# Patient Record
Sex: Male | Born: 1965
Health system: Southern US, Community
[De-identification: ages and names within clinical notes are randomized; demographics above are authoritative.]

## PROBLEM LIST (undated history)

## (undated) DIAGNOSIS — G4733 Obstructive sleep apnea (adult) (pediatric): Secondary | ICD-10-CM

## (undated) DIAGNOSIS — R9431 Abnormal electrocardiogram [ECG] [EKG]: Secondary | ICD-10-CM

## (undated) DIAGNOSIS — N3281 Overactive bladder: Secondary | ICD-10-CM

## (undated) DIAGNOSIS — E7800 Pure hypercholesterolemia, unspecified: Secondary | ICD-10-CM

## (undated) DIAGNOSIS — B029 Zoster without complications: Secondary | ICD-10-CM

## (undated) DIAGNOSIS — C801 Malignant (primary) neoplasm, unspecified: Secondary | ICD-10-CM

## (undated) DIAGNOSIS — I10 Essential (primary) hypertension: Secondary | ICD-10-CM

## (undated) HISTORY — DX: Malignant (primary) neoplasm, unspecified: C80.1

## (undated) HISTORY — DX: Pure hypercholesterolemia, unspecified: E78.00

## (undated) HISTORY — PX: MAXILLARY LE FORTE I OSTEOTOMY: SHX2005

## (undated) HISTORY — DX: Essential (primary) hypertension: I10

## (undated) HISTORY — DX: Overactive bladder: N32.81

## (undated) HISTORY — DX: Obstructive sleep apnea (adult) (pediatric): G47.33

## (undated) HISTORY — PX: SEPTOPLASTY: SUR1290

## (undated) HISTORY — DX: Abnormal electrocardiogram (ECG) (EKG): R94.31

## (undated) HISTORY — DX: Zoster without complications: B02.9

---

## 1997-12-13 HISTORY — PX: VEIN LIGATION AND STRIPPING: SHX2653

## 2001-12-13 HISTORY — PX: VASECTOMY: SHX75

## 2010-12-13 HISTORY — PX: BASAL CELL CARCINOMA EXCISION: SHX1214

## 2011-09-15 ENCOUNTER — Encounter: Payer: Self-pay | Admitting: Vascular Surgery

## 2011-10-05 ENCOUNTER — Encounter: Payer: Self-pay | Admitting: Vascular Surgery

## 2011-10-06 ENCOUNTER — Encounter: Payer: Self-pay | Admitting: Vascular Surgery

## 2011-10-15 ENCOUNTER — Encounter: Payer: Self-pay | Admitting: Vascular Surgery

## 2011-10-18 ENCOUNTER — Encounter: Payer: Self-pay | Admitting: Vascular Surgery

## 2011-10-18 ENCOUNTER — Ambulatory Visit (INDEPENDENT_AMBULATORY_CARE_PROVIDER_SITE_OTHER): Payer: BC Managed Care – PPO | Admitting: Vascular Surgery

## 2011-10-18 ENCOUNTER — Other Ambulatory Visit: Payer: Self-pay | Admitting: *Deleted

## 2011-10-18 VITALS — BP 129/83 | HR 95 | Resp 18 | Ht 72.0 in | Wt 190.0 lb

## 2011-10-18 DIAGNOSIS — I83893 Varicose veins of bilateral lower extremities with other complications: Secondary | ICD-10-CM

## 2011-10-18 NOTE — Progress Notes (Signed)
Subjective:     Patient ID: Derek Chambers, male   DOB: 31-Dec-1965, 45 y.o.   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 He had a partial vein stripping performed in [employer] 14 years ago. At that time he had some bulging varicosities in the right distal thigh. It is not wear elastic compression stockings nor elevate his legs on a regular basis. He now has some aching and throbbing discomfort in the right distal thigh medially and has developed a pattern of superficial bluish discoloration in that area. He has no history of DVT, thrombophlebitis, stasis ulcers, or bleeding.  History reviewed. No pertinent past medical history.  History  Substance Use Topics  . Smoking status: Never Smoker   . Smokeless tobacco: Never Used  . Alcohol Use: Yes    Family History  Problem Relation Age of Onset  . Cancer Mother     Allergies no known allergies  Current outpatient prescriptions:amitriptyline (ELAVIL) 25 MG tablet, Take 25 mg by mouth at bedtime.  , Disp: , Rfl: ;  aspirin 81 MG tablet, Take 81 mg by mouth daily. 2 tabs qd , Disp: , Rfl: ;  glucosamine-chondroitin 500-400 MG tablet, Take 1 tablet by mouth 1 day or 1 dose.  , Disp: , Rfl: ;  magnesium 30 MG tablet, Take 30 mg by mouth 1 day or 1 dose.  , Disp: , Rfl: ;  Melatonin 1 MG TABS, Take by mouth. At bedtime  , Disp: , Rfl:   BP 129/83  Pulse 95  Resp 18  Ht 6' (1.829 m)  Wt 190 lb (86.183 kg)  BMI 25.77 kg/m2  Body mass index is 25.77 kg/(m^2).         Review of Systems denies chest pain, dyspnea on exertion, PND, orthopnea, his review of systems is totally negative     Objective:   Physical Exam blood pressure 129/83 heart rate 85 respirations 18 General he is a well-developed well-nourished male in no apparent distress alert and oriented x3 HEENT normal for age Lungs no rhonchi or wheezing Cardiovascular regular with no murmurs carotid pulses 3+ no audible  bruits Abdomen soft nontender with no masses Lower extremity exam the ulcer plus femoral popliteal and dorsalis pedis pulses palpable bilaterally Neurologic normal Muscle skeletal free of major deformities Skin exam reveals some reticular and spider veins in the medial distal thigh of the right leg. No bulging varicosities are noted. There is no hyperpigmentation or ulceration or distal edema noted.  Today I performed a bedside SonoSite ultrasound exam. I do not see any large great saphenous vein on the right with reflux from the saphenofemoral junction to the knee although the saphenous vein is present but small. Similarly I do not see any large small saphenous vein with reflux.    Assessment:     Reticular and spider veins and right distal thigh-status post vein stripping performed 14 years ago right leg-now symptomatic    Plan:    return for formal venous duplex exam right leg. No evidence of reflux may require sclerotherapy.

## 2011-10-20 ENCOUNTER — Other Ambulatory Visit: Payer: Self-pay

## 2011-10-20 DIAGNOSIS — I83893 Varicose veins of bilateral lower extremities with other complications: Secondary | ICD-10-CM

## 2011-11-12 ENCOUNTER — Encounter: Payer: Self-pay | Admitting: Vascular Surgery

## 2011-11-15 ENCOUNTER — Other Ambulatory Visit (INDEPENDENT_AMBULATORY_CARE_PROVIDER_SITE_OTHER): Payer: BC Managed Care – PPO

## 2011-11-15 ENCOUNTER — Ambulatory Visit (INDEPENDENT_AMBULATORY_CARE_PROVIDER_SITE_OTHER): Payer: BC Managed Care – PPO | Admitting: Vascular Surgery

## 2011-11-15 ENCOUNTER — Encounter: Payer: Self-pay | Admitting: Vascular Surgery

## 2011-11-15 VITALS — BP 132/96 | HR 78 | Resp 18 | Ht 72.0 in | Wt 192.0 lb

## 2011-11-15 DIAGNOSIS — I83893 Varicose veins of bilateral lower extremities with other complications: Secondary | ICD-10-CM

## 2011-11-15 NOTE — Progress Notes (Signed)
Subjective:     Patient ID: Derek Chambers, male   DOB: February 22, 1966, 45 y.o.   MRN: [id number]  HPI this 45 year old male patient returns for further evaluation of his venous insufficiency in the right lower thigh. 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 He has no history of DVT or thrombophlebitis. Today he returned for a venous duplex exam which I reviewed and interpreted.  There is no significant great saphenous vein remaining in the right leg and no reflux at the saphenofemoral junction. There is no DVT. He does have an isolated area of a subcutaneous varicosity in the right distal thigh which communicates with the reticular and spider veins and the skin overlying this area. No incompetent perforators noted. The deep system is unremarkable.  No past medical history on file.  History  Substance Use Topics  . Smoking status: Never Smoker   . Smokeless tobacco: Never Used  . Alcohol Use: Yes    Family History  Problem Relation Age of Onset  . Cancer Mother     Allergies no known allergies  Current outpatient prescriptions:amitriptyline (ELAVIL) 25 MG tablet, Take 25 mg by mouth at bedtime.  , Disp: , Rfl: ;  aspirin 81 MG tablet, Take 81 mg by mouth daily. 2 tabs qd , Disp: , Rfl: ;  glucosamine-chondroitin 500-400 MG tablet, Take 1 tablet by mouth 1 day or 1 dose.  , Disp: , Rfl: ;  magnesium 30 MG tablet, Take 30 mg by mouth 1 day or 1 dose.  , Disp: , Rfl: ;  Melatonin 1 MG TABS, Take by mouth. At bedtime  , Disp: , Rfl:   BP 132/96  Pulse 78  Resp 18  Ht 6' (1.829 m)  Wt 192 lb (87.091 kg)  BMI 26.04 kg/m2  Body mass index is 26.04 kg/(m^2).         Review of Systems     Objective:   Physical Exam blood pressure 130/96 heart rate 78 respirations 18 Right lower  extremity exam reveals superficial reticular and spider veins in the distal third of the right thigh with no bulging varicosities noted no distal edema no hyperpigmentation or ulceration. He has 3+ dorsalis pedis pulse palpable in the right foot.     Assessment:    symptomatic reticular and spider veins right distal thigh post ligation and stripping right great saphenous vein 14 years ago    Plan:    needs sclerotherapy right distal thigh for reticular and spider veins which are symptomatic we'll schedule in near future

## 2012-01-05 NOTE — Procedures (Unsigned)
LOWER EXTREMITY VENOUS REFLUX EXAM  INDICATION:  Partial right lower extremity vein stripping 14 years ago. The patient presents with aching and throbbing of the distal right thigh.  No history of previous DVT or thrombophlebitis.  EXAM:  Using color-flow imaging and pulse Doppler spectral analysis, the right common femoral, superficial femoral, popliteal, posterior tibial, greater and lesser saphenous veins are evaluated.  There is no evidence suggesting deep venous insufficiency in the right lower extremity.  The left lower extremity was not evaluated.  The right saphenofemoral junction is competent.  The right GSV is competent in the proximal thigh.  The right GSV is absent, status post ligation and stripping from the mid thigh to the mid calf.  There is a branch out of the fascia from the mid to distal thigh.  There is a 0.43 cm area which demonstrates reflux in the branch in the distal thigh.  GSV Diameter (used if found to be incompetent only)                                           Right    Left Proximal Greater Saphenous Vein           0.78 cm  cm Proximal-to-mid-thigh                     0.43 cm  cm Mid thigh                                 cm       cm Mid-distal thigh                          cm       cm Distal thigh                              cm       cm Knee                                      cm       cm  IMPRESSION: 1. The right great saphenous vein is competent in the proximal thigh.     The greater saphenous vein is absent from the mid thigh to the mid     calf. 2. The right great saphenous vein is not tortuous. 3. The deep venous system is competent. 4. The right small saphenous vein is competent. 5.  Incompetence is present in a branch in the distal thigh at the     point of the patient's discomfort.  ___________________________________________ Quita Skye. Hart Rochester, M.D.  CI/MEDQ  D:  11/15/2011  T:  11/15/2011  Job:  [id number]

## 2012-02-01 ENCOUNTER — Encounter: Payer: Self-pay | Admitting: *Deleted

## 2012-02-01 ENCOUNTER — Ambulatory Visit (INDEPENDENT_AMBULATORY_CARE_PROVIDER_SITE_OTHER): Payer: BC Managed Care – PPO | Admitting: *Deleted

## 2012-02-01 DIAGNOSIS — I83893 Varicose veins of bilateral lower extremities with other complications: Secondary | ICD-10-CM

## 2012-02-01 NOTE — Progress Notes (Signed)
X=.3% Sotradecol administered with a 27g butterfly.  Patient received a total of 6cc foam.  Treated area where reflux is present. Bit of a difficult stick but able to get in with blood return. Will follow prn.  Photos: yes  Compression stockings applied: yes and ace wrap over treated area.

## 2012-02-02 ENCOUNTER — Encounter: Payer: Self-pay | Admitting: Vascular Surgery

## 2014-08-08 ENCOUNTER — Telehealth: Payer: Self-pay

## 2014-08-08 ENCOUNTER — Encounter: Payer: Self-pay | Admitting: Gastroenterology

## 2014-08-08 NOTE — Telephone Encounter (Signed)
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 Turned 48 this year and needs screening colonoscopy at [facility], Wants to try without sedation but will take an IV just in case he needs meds. Can you contact him about scheduling. He's in no hurry, just would like it done before end of the year.   Thanks   His cell is [phone]

## 2014-08-22 NOTE — Telephone Encounter (Signed)
Procedure has been scheduled.

## 2014-10-02 ENCOUNTER — Ambulatory Visit (AMBULATORY_SURGERY_CENTER): Payer: Self-pay | Admitting: *Deleted

## 2014-10-02 ENCOUNTER — Telehealth: Payer: Self-pay | Admitting: *Deleted

## 2014-10-02 VITALS — Ht 72.0 in | Wt 197.6 lb

## 2014-10-02 DIAGNOSIS — Z1211 Encounter for screening for malignant neoplasm of colon: Secondary | ICD-10-CM

## 2014-10-02 MED ORDER — MOVIPREP 100 G PO SOLR: 1.0000 | Freq: Once | ORAL | Status: DC

## 2014-10-02 NOTE — Telephone Encounter (Signed)
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 TE from 08/08/14 states he does not want sedation but will take IV in case, after speaking with Dr Derek Chambers, he is definitely for sedation, he states "yes I want some kind of sedation", we spoke about the option of fentanyl-versed and propofol, final decision was to have propofol during colonoscopy, he may wanted to speak to you about sedation on 11/3 as well, thanks-adm

## 2014-10-02 NOTE — Progress Notes (Signed)
No egg or soy allergy. No anesthesia problems.  No home O2.  No diet meds.  

## 2014-10-03 NOTE — Telephone Encounter (Signed)
Ok, thanks.

## 2014-10-08 ENCOUNTER — Encounter: Payer: Self-pay | Admitting: Gastroenterology

## 2014-10-15 ENCOUNTER — Ambulatory Visit (AMBULATORY_SURGERY_CENTER): Payer: BC Managed Care – PPO | Admitting: Gastroenterology

## 2014-10-15 ENCOUNTER — Encounter: Payer: Self-pay | Admitting: Gastroenterology

## 2014-10-15 VITALS — BP 123/85 | HR 65 | Temp 97.6°F | Resp 13 | Ht 72.0 in | Wt 197.0 lb

## 2014-10-15 DIAGNOSIS — Z1211 Encounter for screening for malignant neoplasm of colon: Secondary | ICD-10-CM

## 2014-10-15 MED ORDER — SODIUM CHLORIDE 0.9 % IV SOLN: 500.0000 mL | INTRAVENOUS | Status: DC

## 2014-10-15 NOTE — Op Note (Addendum)
[place]  [address]. [place], [address]   COLONOSCOPY PROCEDURE REPORT  PATIENT: Derek Chambers, Derek Chambers  MR#: [id number] BIRTHDATE: 07/09/1966 , 49  yrs. old GENDER: male ENDOSCOPIST: Milus Banister REFERRED EK:BTCY Laurann Montana, M.D. PROCEDURE DATE:  10/15/2014 PROCEDURE:   Colonoscopy, screening First Screening Colonoscopy - Avg.  risk and is 50 yrs.  old or older Yes.  Prior Negative Screening - Now for repeat screening. N/A  History of Adenoma - Now for follow-up colonoscopy & has been > or = to 3 yrs.  N/A  Polyps Removed Today? No.  Recommend repeat exam, <10 yrs? No. ASA CLASS:   Class II INDICATIONS:average risk for colon cancer. MEDICATIONS: Monitored anesthesia care and Propofol 300 mg IV  DESCRIPTION OF PROCEDURE:   After the risks benefits and alternatives of the procedure were thoroughly explained, informed consent was obtained.  The digital rectal exam revealed no abnormalities of the rectum.   The LB [id number]  endoscope was introduced through the anus and advanced to the cecum, which was identified by both the appendix and ileocecal valve. No adverse events experienced.   The quality of the prep was good, using MoviPrep  The instrument was then slowly withdrawn as the colon was fully examined.   COLON FINDINGS: A normal appearing cecum, ileocecal valve, and appendiceal orifice were identified.  The ascending, transverse, descending, sigmoid colon, and rectum appeared unremarkable. Retroflexed views revealed no abnormalities. The time to cecum=3 minutes 51 seconds.  Withdrawal time=8 minutes 01 seconds.  The scope was withdrawn and the procedure completed. COMPLICATIONS: There were no immediate complications.  ENDOSCOPIC IMPRESSION: Normal colonoscopy No polyps or cancers  RECOMMENDATIONS: You should continue to follow colorectal cancer screening guidelines for "routine risk" patients with a repeat colonoscopy in 10 years.   eSigned:   Milus Banister, MD 05/14/2015 10:41 AM Revised: 05/14/2015 10:41 AM   Originally I documented "poor" prep however that was a mistake.  His prep was good.  I discussed this with Dr. Delma Post shortly after the procedure and I have now just fixed that error on his report.

## 2014-10-15 NOTE — Patient Instructions (Signed)

## 2014-10-15 NOTE — Progress Notes (Signed)
Report to PACU, RN, vss, BBS= Clear.  

## 2014-10-16 ENCOUNTER — Telehealth: Payer: Self-pay | Admitting: *Deleted

## 2014-10-16 NOTE — Telephone Encounter (Signed)
  Follow up Call-  Call back number 10/15/2014  Post procedure Call Back phone  # [phone]  Permission to leave phone message Yes     Patient questions:  Do you have a fever, pain , or abdominal swelling? No. Pain Score  0 *  Have you tolerated food without any problems? Yes.    Have you been able to return to your normal activities? Yes.    Do you have any questions about your discharge instructions: Diet   No. Medications  No. Follow up visit  No.  Do you have questions or concerns about your Care? No.  Actions: * If pain score is 4 or above: No action needed, pain <4.

## 2015-12-14 HISTORY — PX: MELANOMA EXCISION: SHX5266

## 2016-10-16 DIAGNOSIS — G4733 Obstructive sleep apnea (adult) (pediatric): Secondary | ICD-10-CM | POA: Diagnosis not present

## 2016-10-26 DIAGNOSIS — G4733 Obstructive sleep apnea (adult) (pediatric): Secondary | ICD-10-CM | POA: Diagnosis not present

## 2016-11-15 DIAGNOSIS — G4733 Obstructive sleep apnea (adult) (pediatric): Secondary | ICD-10-CM | POA: Diagnosis not present

## 2016-12-16 DIAGNOSIS — G4733 Obstructive sleep apnea (adult) (pediatric): Secondary | ICD-10-CM | POA: Diagnosis not present

## 2016-12-28 DIAGNOSIS — D225 Melanocytic nevi of trunk: Secondary | ICD-10-CM | POA: Diagnosis not present

## 2016-12-28 DIAGNOSIS — D1801 Hemangioma of skin and subcutaneous tissue: Secondary | ICD-10-CM | POA: Diagnosis not present

## 2016-12-28 DIAGNOSIS — L918 Other hypertrophic disorders of the skin: Secondary | ICD-10-CM | POA: Diagnosis not present

## 2016-12-28 DIAGNOSIS — Z85820 Personal history of malignant melanoma of skin: Secondary | ICD-10-CM | POA: Diagnosis not present

## 2016-12-28 DIAGNOSIS — D2271 Melanocytic nevi of right lower limb, including hip: Secondary | ICD-10-CM | POA: Diagnosis not present

## 2016-12-28 DIAGNOSIS — L738 Other specified follicular disorders: Secondary | ICD-10-CM | POA: Diagnosis not present

## 2016-12-28 DIAGNOSIS — L821 Other seborrheic keratosis: Secondary | ICD-10-CM | POA: Diagnosis not present

## 2016-12-28 DIAGNOSIS — Z85828 Personal history of other malignant neoplasm of skin: Secondary | ICD-10-CM | POA: Diagnosis not present

## 2016-12-28 DIAGNOSIS — D2272 Melanocytic nevi of left lower limb, including hip: Secondary | ICD-10-CM | POA: Diagnosis not present

## 2017-01-16 DIAGNOSIS — G4733 Obstructive sleep apnea (adult) (pediatric): Secondary | ICD-10-CM | POA: Diagnosis not present

## 2017-02-11 MED FILL — AMITRIPTYLINE HCL 25 MG TAB: 25 | 90 days supply | Qty: 90 | Fill #0

## 2017-02-13 DIAGNOSIS — G4733 Obstructive sleep apnea (adult) (pediatric): Secondary | ICD-10-CM | POA: Diagnosis not present

## 2017-03-11 DIAGNOSIS — G4733 Obstructive sleep apnea (adult) (pediatric): Secondary | ICD-10-CM | POA: Diagnosis not present

## 2017-03-16 DIAGNOSIS — G4733 Obstructive sleep apnea (adult) (pediatric): Secondary | ICD-10-CM | POA: Diagnosis not present

## 2017-04-15 DIAGNOSIS — G4733 Obstructive sleep apnea (adult) (pediatric): Secondary | ICD-10-CM | POA: Diagnosis not present

## 2017-06-21 DIAGNOSIS — D225 Melanocytic nevi of trunk: Secondary | ICD-10-CM | POA: Diagnosis not present

## 2017-06-21 DIAGNOSIS — C44619 Basal cell carcinoma of skin of left upper limb, including shoulder: Secondary | ICD-10-CM | POA: Diagnosis not present

## 2017-06-21 DIAGNOSIS — L738 Other specified follicular disorders: Secondary | ICD-10-CM | POA: Diagnosis not present

## 2017-06-21 DIAGNOSIS — Z85820 Personal history of malignant melanoma of skin: Secondary | ICD-10-CM | POA: Diagnosis not present

## 2017-06-21 DIAGNOSIS — D1801 Hemangioma of skin and subcutaneous tissue: Secondary | ICD-10-CM | POA: Diagnosis not present

## 2017-06-21 DIAGNOSIS — D485 Neoplasm of uncertain behavior of skin: Secondary | ICD-10-CM | POA: Diagnosis not present

## 2017-06-21 DIAGNOSIS — D2271 Melanocytic nevi of right lower limb, including hip: Secondary | ICD-10-CM | POA: Diagnosis not present

## 2017-06-21 DIAGNOSIS — L814 Other melanin hyperpigmentation: Secondary | ICD-10-CM | POA: Diagnosis not present

## 2017-06-29 DIAGNOSIS — C44619 Basal cell carcinoma of skin of left upper limb, including shoulder: Secondary | ICD-10-CM | POA: Diagnosis not present

## 2017-06-29 DIAGNOSIS — Z85828 Personal history of other malignant neoplasm of skin: Secondary | ICD-10-CM | POA: Diagnosis not present

## 2017-06-30 DIAGNOSIS — G4733 Obstructive sleep apnea (adult) (pediatric): Secondary | ICD-10-CM | POA: Diagnosis not present

## 2017-07-08 DIAGNOSIS — G4733 Obstructive sleep apnea (adult) (pediatric): Secondary | ICD-10-CM | POA: Diagnosis not present

## 2017-08-05 MED FILL — AMITRIPTYLINE HCL 25 MG TAB: 25 | 90 days supply | Qty: 90 | Fill #1

## 2017-08-30 DIAGNOSIS — N3281 Overactive bladder: Secondary | ICD-10-CM | POA: Diagnosis not present

## 2017-08-30 DIAGNOSIS — Z0000 Encounter for general adult medical examination without abnormal findings: Secondary | ICD-10-CM | POA: Diagnosis not present

## 2017-08-30 DIAGNOSIS — G4733 Obstructive sleep apnea (adult) (pediatric): Secondary | ICD-10-CM | POA: Diagnosis not present

## 2017-11-07 MED FILL — AMITRIPTYLINE HCL 25 MG TAB: 25 | 90 days supply | Qty: 90 | Fill #0

## 2018-01-04 DIAGNOSIS — D2271 Melanocytic nevi of right lower limb, including hip: Secondary | ICD-10-CM | POA: Diagnosis not present

## 2018-01-04 DIAGNOSIS — D485 Neoplasm of uncertain behavior of skin: Secondary | ICD-10-CM | POA: Diagnosis not present

## 2018-01-04 DIAGNOSIS — D225 Melanocytic nevi of trunk: Secondary | ICD-10-CM | POA: Diagnosis not present

## 2018-01-04 DIAGNOSIS — D2272 Melanocytic nevi of left lower limb, including hip: Secondary | ICD-10-CM | POA: Diagnosis not present

## 2018-01-04 DIAGNOSIS — Z85828 Personal history of other malignant neoplasm of skin: Secondary | ICD-10-CM | POA: Diagnosis not present

## 2018-01-04 DIAGNOSIS — L739 Follicular disorder, unspecified: Secondary | ICD-10-CM | POA: Diagnosis not present

## 2018-01-04 DIAGNOSIS — H61001 Unspecified perichondritis of right external ear: Secondary | ICD-10-CM | POA: Diagnosis not present

## 2018-01-04 DIAGNOSIS — Z85820 Personal history of malignant melanoma of skin: Secondary | ICD-10-CM | POA: Diagnosis not present

## 2018-01-04 DIAGNOSIS — L738 Other specified follicular disorders: Secondary | ICD-10-CM | POA: Diagnosis not present

## 2018-01-04 DIAGNOSIS — L821 Other seborrheic keratosis: Secondary | ICD-10-CM | POA: Diagnosis not present

## 2018-01-04 DIAGNOSIS — D1801 Hemangioma of skin and subcutaneous tissue: Secondary | ICD-10-CM | POA: Diagnosis not present

## 2018-02-03 MED FILL — AMITRIPTYLINE HCL 25 MG TAB: 25 | 90 days supply | Qty: 90 | Fill #1

## 2018-02-08 DIAGNOSIS — H2513 Age-related nuclear cataract, bilateral: Secondary | ICD-10-CM | POA: Diagnosis not present

## 2018-02-08 DIAGNOSIS — H40013 Open angle with borderline findings, low risk, bilateral: Secondary | ICD-10-CM | POA: Diagnosis not present

## 2018-04-07 DIAGNOSIS — G4733 Obstructive sleep apnea (adult) (pediatric): Secondary | ICD-10-CM | POA: Diagnosis not present

## 2018-05-05 MED FILL — AMITRIPTYLINE HCL 25 MG TAB: 25 | 90 days supply | Qty: 90 | Fill #0

## 2018-08-07 MED FILL — AMITRIPTYLINE HCL 25 MG TAB: 25 | 90 days supply | Qty: 90 | Fill #1

## 2018-09-04 DIAGNOSIS — Z125 Encounter for screening for malignant neoplasm of prostate: Secondary | ICD-10-CM | POA: Diagnosis not present

## 2018-09-04 DIAGNOSIS — N3281 Overactive bladder: Secondary | ICD-10-CM | POA: Diagnosis not present

## 2018-09-04 DIAGNOSIS — Z0000 Encounter for general adult medical examination without abnormal findings: Secondary | ICD-10-CM | POA: Diagnosis not present

## 2018-09-04 DIAGNOSIS — G4733 Obstructive sleep apnea (adult) (pediatric): Secondary | ICD-10-CM | POA: Diagnosis not present

## 2018-09-04 DIAGNOSIS — Z23 Encounter for immunization: Secondary | ICD-10-CM | POA: Diagnosis not present

## 2018-09-08 DIAGNOSIS — L821 Other seborrheic keratosis: Secondary | ICD-10-CM | POA: Diagnosis not present

## 2018-09-08 DIAGNOSIS — D1801 Hemangioma of skin and subcutaneous tissue: Secondary | ICD-10-CM | POA: Diagnosis not present

## 2018-09-08 DIAGNOSIS — Z85820 Personal history of malignant melanoma of skin: Secondary | ICD-10-CM | POA: Diagnosis not present

## 2018-09-08 DIAGNOSIS — Z85828 Personal history of other malignant neoplasm of skin: Secondary | ICD-10-CM | POA: Diagnosis not present

## 2018-09-08 DIAGNOSIS — D2271 Melanocytic nevi of right lower limb, including hip: Secondary | ICD-10-CM | POA: Diagnosis not present

## 2018-09-08 DIAGNOSIS — D2262 Melanocytic nevi of left upper limb, including shoulder: Secondary | ICD-10-CM | POA: Diagnosis not present

## 2018-09-08 DIAGNOSIS — D2272 Melanocytic nevi of left lower limb, including hip: Secondary | ICD-10-CM | POA: Diagnosis not present

## 2018-09-08 DIAGNOSIS — D225 Melanocytic nevi of trunk: Secondary | ICD-10-CM | POA: Diagnosis not present

## 2018-11-02 MED FILL — AMITRIPTYLINE HCL 25 MG TAB: 25 | 90 days supply | Qty: 90 | Fill #0

## 2018-12-29 DIAGNOSIS — G4733 Obstructive sleep apnea (adult) (pediatric): Secondary | ICD-10-CM | POA: Diagnosis not present

## 2019-01-29 MED FILL — AMITRIPTYLINE HCL 25 MG TAB: 25 | 90 days supply | Qty: 90 | Fill #1

## 2019-03-16 DIAGNOSIS — D2371 Other benign neoplasm of skin of right lower limb, including hip: Secondary | ICD-10-CM | POA: Diagnosis not present

## 2019-03-16 DIAGNOSIS — D2271 Melanocytic nevi of right lower limb, including hip: Secondary | ICD-10-CM | POA: Diagnosis not present

## 2019-03-16 DIAGNOSIS — D225 Melanocytic nevi of trunk: Secondary | ICD-10-CM | POA: Diagnosis not present

## 2019-03-16 DIAGNOSIS — Z85828 Personal history of other malignant neoplasm of skin: Secondary | ICD-10-CM | POA: Diagnosis not present

## 2019-03-16 DIAGNOSIS — L821 Other seborrheic keratosis: Secondary | ICD-10-CM | POA: Diagnosis not present

## 2019-03-16 DIAGNOSIS — L738 Other specified follicular disorders: Secondary | ICD-10-CM | POA: Diagnosis not present

## 2019-03-16 DIAGNOSIS — D1801 Hemangioma of skin and subcutaneous tissue: Secondary | ICD-10-CM | POA: Diagnosis not present

## 2019-03-16 DIAGNOSIS — Z85820 Personal history of malignant melanoma of skin: Secondary | ICD-10-CM | POA: Diagnosis not present

## 2019-04-25 MED FILL — AMITRIPTYLINE HCL 25 MG TAB: 25 | 90 days supply | Qty: 90 | Fill #2

## 2019-07-04 DIAGNOSIS — H0102B Squamous blepharitis left eye, upper and lower eyelids: Secondary | ICD-10-CM | POA: Diagnosis not present

## 2019-07-04 DIAGNOSIS — H2513 Age-related nuclear cataract, bilateral: Secondary | ICD-10-CM | POA: Diagnosis not present

## 2019-07-04 DIAGNOSIS — H0102A Squamous blepharitis right eye, upper and lower eyelids: Secondary | ICD-10-CM | POA: Diagnosis not present

## 2019-07-04 DIAGNOSIS — H00025 Hordeolum internum left lower eyelid: Secondary | ICD-10-CM | POA: Diagnosis not present

## 2019-07-04 MED FILL — DOXYCYCLINE HYCLATE 100 MG: 100 | 30 days supply | Qty: 60 | Fill #0

## 2019-07-25 MED FILL — AMITRIPTYLINE HCL 25 MG TAB: 25 | 90 days supply | Qty: 90 | Fill #0

## 2019-07-25 MED FILL — NEO/POLY/DEXAMET EYE OINT: 3.5-10000-0 | 7 days supply | Qty: 4 | Fill #0

## 2019-07-25 MED FILL — NEO/POLYMYXIN/DEXAMETH DROP: 3.5-10000-0 | 7 days supply | Qty: 5 | Fill #0

## 2019-09-10 DIAGNOSIS — Z13220 Encounter for screening for lipoid disorders: Secondary | ICD-10-CM | POA: Diagnosis not present

## 2019-09-10 DIAGNOSIS — N3281 Overactive bladder: Secondary | ICD-10-CM | POA: Diagnosis not present

## 2019-09-10 DIAGNOSIS — Z0000 Encounter for general adult medical examination without abnormal findings: Secondary | ICD-10-CM | POA: Diagnosis not present

## 2019-09-10 DIAGNOSIS — Z23 Encounter for immunization: Secondary | ICD-10-CM | POA: Diagnosis not present

## 2019-09-10 DIAGNOSIS — G4733 Obstructive sleep apnea (adult) (pediatric): Secondary | ICD-10-CM | POA: Diagnosis not present

## 2019-09-10 DIAGNOSIS — Z1159 Encounter for screening for other viral diseases: Secondary | ICD-10-CM | POA: Diagnosis not present

## 2019-09-19 DIAGNOSIS — Z85820 Personal history of malignant melanoma of skin: Secondary | ICD-10-CM | POA: Diagnosis not present

## 2019-09-19 DIAGNOSIS — L821 Other seborrheic keratosis: Secondary | ICD-10-CM | POA: Diagnosis not present

## 2019-09-19 DIAGNOSIS — B009 Herpesviral infection, unspecified: Secondary | ICD-10-CM | POA: Diagnosis not present

## 2019-09-19 DIAGNOSIS — C4441 Basal cell carcinoma of skin of scalp and neck: Secondary | ICD-10-CM | POA: Diagnosis not present

## 2019-09-19 DIAGNOSIS — D225 Melanocytic nevi of trunk: Secondary | ICD-10-CM | POA: Diagnosis not present

## 2019-09-19 DIAGNOSIS — D1801 Hemangioma of skin and subcutaneous tissue: Secondary | ICD-10-CM | POA: Diagnosis not present

## 2019-09-19 DIAGNOSIS — Z85828 Personal history of other malignant neoplasm of skin: Secondary | ICD-10-CM | POA: Diagnosis not present

## 2019-09-19 DIAGNOSIS — D2271 Melanocytic nevi of right lower limb, including hip: Secondary | ICD-10-CM | POA: Diagnosis not present

## 2019-09-19 DIAGNOSIS — D485 Neoplasm of uncertain behavior of skin: Secondary | ICD-10-CM | POA: Diagnosis not present

## 2019-09-19 DIAGNOSIS — L738 Other specified follicular disorders: Secondary | ICD-10-CM | POA: Diagnosis not present

## 2019-10-15 DIAGNOSIS — C4441 Basal cell carcinoma of skin of scalp and neck: Secondary | ICD-10-CM | POA: Diagnosis not present

## 2019-10-15 DIAGNOSIS — Z85828 Personal history of other malignant neoplasm of skin: Secondary | ICD-10-CM | POA: Diagnosis not present

## 2019-10-15 MED FILL — DOXYCYCLINE HYC 100 MG CAPS: 100 | 5 days supply | Qty: 10 | Fill #0

## 2019-11-02 MED FILL — AMITRIPTYLINE HCL 25 MG TAB: 25 | 90 days supply | Qty: 90 | Fill #1

## 2019-11-12 DIAGNOSIS — Z23 Encounter for immunization: Secondary | ICD-10-CM | POA: Diagnosis not present

## 2020-01-29 MED FILL — AMITRIPTYLINE HCL 25 MG TAB: 25 | 90 days supply | Qty: 90 | Fill #2

## 2020-02-11 DIAGNOSIS — H2513 Age-related nuclear cataract, bilateral: Secondary | ICD-10-CM | POA: Diagnosis not present

## 2020-02-11 DIAGNOSIS — H0102B Squamous blepharitis left eye, upper and lower eyelids: Secondary | ICD-10-CM | POA: Diagnosis not present

## 2020-02-11 DIAGNOSIS — H0102A Squamous blepharitis right eye, upper and lower eyelids: Secondary | ICD-10-CM | POA: Diagnosis not present

## 2020-02-11 DIAGNOSIS — H40013 Open angle with borderline findings, low risk, bilateral: Secondary | ICD-10-CM | POA: Diagnosis not present

## 2020-04-04 DIAGNOSIS — Z85820 Personal history of malignant melanoma of skin: Secondary | ICD-10-CM | POA: Diagnosis not present

## 2020-04-04 DIAGNOSIS — D1801 Hemangioma of skin and subcutaneous tissue: Secondary | ICD-10-CM | POA: Diagnosis not present

## 2020-04-04 DIAGNOSIS — L738 Other specified follicular disorders: Secondary | ICD-10-CM | POA: Diagnosis not present

## 2020-04-04 DIAGNOSIS — D225 Melanocytic nevi of trunk: Secondary | ICD-10-CM | POA: Diagnosis not present

## 2020-04-04 DIAGNOSIS — L821 Other seborrheic keratosis: Secondary | ICD-10-CM | POA: Diagnosis not present

## 2020-04-04 DIAGNOSIS — D2271 Melanocytic nevi of right lower limb, including hip: Secondary | ICD-10-CM | POA: Diagnosis not present

## 2020-04-04 DIAGNOSIS — Z85828 Personal history of other malignant neoplasm of skin: Secondary | ICD-10-CM | POA: Diagnosis not present

## 2020-04-17 DIAGNOSIS — G4733 Obstructive sleep apnea (adult) (pediatric): Secondary | ICD-10-CM | POA: Diagnosis not present

## 2020-04-30 MED FILL — AMITRIPTYLINE HCL 25 MG TAB: 25 | 90 days supply | Qty: 90 | Fill #3

## 2020-07-30 ENCOUNTER — Other Ambulatory Visit (HOSPITAL_COMMUNITY): Payer: Self-pay | Admitting: Internal Medicine

## 2020-07-30 MED FILL — AMITRIPTYLINE HCL 25 MG TAB: 25 | 90 days supply | Qty: 90 | Fill #0

## 2020-08-05 DIAGNOSIS — G4733 Obstructive sleep apnea (adult) (pediatric): Secondary | ICD-10-CM | POA: Diagnosis not present

## 2020-10-01 DIAGNOSIS — D2271 Melanocytic nevi of right lower limb, including hip: Secondary | ICD-10-CM | POA: Diagnosis not present

## 2020-10-01 DIAGNOSIS — L814 Other melanin hyperpigmentation: Secondary | ICD-10-CM | POA: Diagnosis not present

## 2020-10-01 DIAGNOSIS — L821 Other seborrheic keratosis: Secondary | ICD-10-CM | POA: Diagnosis not present

## 2020-10-01 DIAGNOSIS — L700 Acne vulgaris: Secondary | ICD-10-CM | POA: Diagnosis not present

## 2020-10-01 DIAGNOSIS — D225 Melanocytic nevi of trunk: Secondary | ICD-10-CM | POA: Diagnosis not present

## 2020-10-01 DIAGNOSIS — D1801 Hemangioma of skin and subcutaneous tissue: Secondary | ICD-10-CM | POA: Diagnosis not present

## 2020-10-01 DIAGNOSIS — Z85820 Personal history of malignant melanoma of skin: Secondary | ICD-10-CM | POA: Diagnosis not present

## 2020-10-01 DIAGNOSIS — Z85828 Personal history of other malignant neoplasm of skin: Secondary | ICD-10-CM | POA: Diagnosis not present

## 2020-10-01 DIAGNOSIS — D2261 Melanocytic nevi of right upper limb, including shoulder: Secondary | ICD-10-CM | POA: Diagnosis not present

## 2020-10-23 MED FILL — AMITRIPTYLINE HCL 25 MG TAB: 25 | 90 days supply | Qty: 90 | Fill #1

## 2021-01-02 DIAGNOSIS — I493 Ventricular premature depolarization: Secondary | ICD-10-CM | POA: Diagnosis not present

## 2021-01-02 DIAGNOSIS — Z125 Encounter for screening for malignant neoplasm of prostate: Secondary | ICD-10-CM | POA: Diagnosis not present

## 2021-01-02 DIAGNOSIS — R002 Palpitations: Secondary | ICD-10-CM | POA: Diagnosis not present

## 2021-01-02 DIAGNOSIS — R030 Elevated blood-pressure reading, without diagnosis of hypertension: Secondary | ICD-10-CM | POA: Diagnosis not present

## 2021-01-02 DIAGNOSIS — Z23 Encounter for immunization: Secondary | ICD-10-CM | POA: Diagnosis not present

## 2021-01-02 DIAGNOSIS — Z13220 Encounter for screening for lipoid disorders: Secondary | ICD-10-CM | POA: Diagnosis not present

## 2021-01-02 DIAGNOSIS — N3281 Overactive bladder: Secondary | ICD-10-CM | POA: Diagnosis not present

## 2021-01-02 DIAGNOSIS — G4733 Obstructive sleep apnea (adult) (pediatric): Secondary | ICD-10-CM | POA: Diagnosis not present

## 2021-01-05 ENCOUNTER — Other Ambulatory Visit (HOSPITAL_COMMUNITY): Payer: Self-pay | Admitting: Internal Medicine

## 2021-01-05 DIAGNOSIS — I493 Ventricular premature depolarization: Secondary | ICD-10-CM

## 2021-01-12 DIAGNOSIS — G4733 Obstructive sleep apnea (adult) (pediatric): Secondary | ICD-10-CM | POA: Diagnosis not present

## 2021-01-14 ENCOUNTER — Ambulatory Visit (HOSPITAL_COMMUNITY): Payer: 59

## 2021-01-19 ENCOUNTER — Ambulatory Visit (HOSPITAL_COMMUNITY)
Admission: RE | Admit: 2021-01-19 | Discharge: 2021-01-19 | Disposition: A | Discharge status: Home / Self Care | Payer: 59 | Source: Ambulatory Visit | Attending: Internal Medicine | Admitting: Internal Medicine

## 2021-01-19 ENCOUNTER — Other Ambulatory Visit: Payer: Self-pay

## 2021-01-19 DIAGNOSIS — I493 Ventricular premature depolarization: Secondary | ICD-10-CM | POA: Insufficient documentation

## 2021-01-19 LAB — ECHOCARDIOGRAM COMPLETE
Area-P 1/2: 3.99 cm2
S' Lateral: 4.2 cm

## 2021-01-19 NOTE — Progress Notes (Signed)
Echocardiogram 2D Echocardiogram has been performed.  Oneal Deputy Davanta Meuser 01/19/2021, 9:17 AM

## 2021-01-22 MED FILL — AMITRIPTYLINE HCL 25 MG TAB: 25 | 90 days supply | Qty: 90 | Fill #2

## 2021-02-20 ENCOUNTER — Encounter: Payer: Self-pay | Admitting: *Deleted

## 2021-02-20 DIAGNOSIS — I8390 Asymptomatic varicose veins of unspecified lower extremity: Secondary | ICD-10-CM | POA: Insufficient documentation

## 2021-02-20 DIAGNOSIS — G4733 Obstructive sleep apnea (adult) (pediatric): Secondary | ICD-10-CM | POA: Insufficient documentation

## 2021-02-20 DIAGNOSIS — I493 Ventricular premature depolarization: Secondary | ICD-10-CM | POA: Insufficient documentation

## 2021-02-20 DIAGNOSIS — N3281 Overactive bladder: Secondary | ICD-10-CM | POA: Insufficient documentation

## 2021-02-23 ENCOUNTER — Ambulatory Visit (INDEPENDENT_AMBULATORY_CARE_PROVIDER_SITE_OTHER): Payer: 59

## 2021-02-23 ENCOUNTER — Other Ambulatory Visit: Payer: Self-pay

## 2021-02-23 ENCOUNTER — Encounter: Payer: Self-pay | Admitting: *Deleted

## 2021-02-23 ENCOUNTER — Encounter: Payer: Self-pay | Admitting: Internal Medicine

## 2021-02-23 ENCOUNTER — Ambulatory Visit (INDEPENDENT_AMBULATORY_CARE_PROVIDER_SITE_OTHER): Payer: 59 | Admitting: Internal Medicine

## 2021-02-23 VITALS — BP 154/78 | HR 68 | Ht 72.0 in | Wt 204.2 lb

## 2021-02-23 DIAGNOSIS — I493 Ventricular premature depolarization: Secondary | ICD-10-CM

## 2021-02-23 NOTE — Progress Notes (Signed)
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 Laurann Montana for evaluation of PVC's. 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 He has noticed several months of palpitations and has noted that he has been more sedentary. He admits to dietary indiscretion. He reduced his caffeine and ETOH and sugar intake. His PVC's have improved. His symptoms are worse in the morning. He is still walking 3 miles a day, and has no angina. He had a 2D echo which demonstrated some aortic sclerosis but not stenosis, preserved LV function, but a question of increased LV dimensions. There is a question about whether the angulation on the echo was perfectly perpendicular. Despite his dietary changes, his PVC's remain and are bothersome. He denies CHF symptoms.  No Known Allergies   Current Outpatient Medications  Medication Sig Dispense Refill  . amitriptyline (ELAVIL) 25 MG tablet Take 25 mg by mouth at bedtime.    Marland Kitchen aspirin 81 MG tablet Take 81 mg by mouth daily. 2 tabs qd    . cholecalciferol (VITAMIN D) 1000 UNITS tablet Take 1,000 Units by mouth daily.    Marland Kitchen Lysine 500 MG TABS Take 1 tablet by mouth daily.     No current facility-administered medications for this visit.     Past Medical History:  Diagnosis Date  . Cancer ([place])    basal cell (skin)  . Elevated cholesterol   . Herpes zoster    teenager  . Obstructive sleep apnea    mild, REI 8, desaturation to 79% probably REM CPAP 2017  . Overactive bladder    [place]  . Shortened PR interval    on EKG    ROS:   All systems reviewed and negative except as noted in the HPI.   Past Surgical History:  Procedure Laterality Date  . BASAL CELL CARCINOMA EXCISION  2012  . MAXILLARY LE FORTE I OSTEOTOMY    . MELANOMA EXCISION  12/2015  . SEPTOPLASTY    . VASECTOMY  2003  . VEIN LIGATION AND STRIPPING  1999     Family History  Problem Relation Age of Onset  . Cancer Mother   . Pancreatic cancer Mother 26  . Hypertension Father 63  . CVA  Father   . Atrial fibrillation Father   . GER disease Sister   . GI Bleed Sister   . Depression Sister   . Growth hormone deficiency Son   . Prostate cancer Paternal Uncle   . Colon cancer Neg Hx      Social History   Socioeconomic History  . Marital status: Married    Spouse name: Not on file  . Number of children: Not on file  . Years of education: Not on file  . Highest education level: Not on file  Occupational History  . Not on file  Tobacco Use  . Smoking status: Never Smoker  . Smokeless tobacco: Never Used  Substance and Sexual Activity  . Alcohol use: Yes    Comment: 2-3 glasses of wine per week  . Drug use: No  . Sexual activity: Not on file  Other Topics Concern  . Not on file  Social History Narrative  . Not on file   Social Determinants of Health   Financial Resource Strain: Not on file  Food Insecurity: Not on file  Transportation Needs: Not on file  Physical Activity: Not on file  Stress: Not on file  Social Connections: Not on file  Intimate Partner Violence: Not on  file     BP (!) 154/78   Pulse 68   Ht 6' (1.829 m)   Wt 204 lb 3.2 oz (92.6 kg)   SpO2 96%   BMI 27.69 kg/m   Physical Exam:  Well appearing middle aged man, NAD HEENT: Unremarkable Neck:  No JVD, no thyromegally Lymphatics:  No adenopathy Back:  No CVA tenderness Lungs:  Clear with no wheezes HEART:  Regular rate rhythm, no murmurs, no rubs, no clicks Abd:  soft, positive bowel sounds, no organomegally, no rebound, no guarding Ext:  2 plus pulses, no edema, no cyanosis, no clubbing Skin:  No rashes no nodules Neuro:  CN II through XII intact, motor grossly intact  EKG - NSR with no PVC's   Assess/Plan: 1. Symptomatic PVC's - His symptoms have improved with the dietary changes noted above but have not resolved. We discussed beta blocker therapy and sorting out the LV chamber sizes. I have reviewed the echo with Dr. Harrington Challenger. We will plan to obtain a cardiac MRI to rule  out structural issues with the LV. Also, I think a 3 day zio monitor is indicated. I will see him back after the above. We discussed inititiation of low dose toprol.   Carleene Overlie Minnie Legros,MD

## 2021-02-23 NOTE — Patient Instructions (Addendum)
Medication Instructions:  Your physician recommends that you continue on your current medications as directed. Please refer to the Current Medication list given to you today.  Labwork: None ordered.  Testing/Procedures: Your physician has recommended that you wear a holter monitor. Holter monitors are medical devices that record the heart's electrical activity. Doctors most often use these monitors to diagnose arrhythmias. Arrhythmias are problems with the speed or rhythm of the heartbeat. The monitor is a small, portable device. You can wear one while you do your normal daily activities. This is usually used to diagnose what is causing palpitations/syncope (passing out).  You will wear a 3 day monitor  Follow-Up: Your physician wants you to follow-up in: based on results of your heart monitor  Any Other Special Instructions Will Be Listed Below (If Applicable).  If you need a refill on your cardiac medications before your next appointment, please call your pharmacy.   ZIO XT- Long Term Monitor Instructions   Your physician has requested you wear your ZIO patch monitor 3 days.   This is a single patch monitor.  [employer] supplies one patch monitor per enrollment.  Additional stickers are not available.   Please do not apply patch if you will be having a Nuclear Stress Test, Echocardiogram, Cardiac CT, MRI, or Chest Xray during the time frame you would be wearing the monitor. The patch cannot be worn during these tests.  You cannot remove and re-apply the ZIO XT patch monitor.   Your ZIO patch monitor will be sent USPS Priority mail from [facility] directly to your home address. The monitor may also be mailed to a PO BOX if home delivery is not available.   It may take 3-5 days to receive your monitor after you have been enrolled.   Once you have received you monitor, please review enclosed instructions.  Your monitor has already been registered assigning a specific monitor serial  # to you.   Applying the monitor   Shave hair from upper left chest.   Hold abrader disc by orange tab.  Rub abrader in 40 strokes over left upper chest as indicated in your monitor instructions.   Clean area with 4 enclosed alcohol pads .  Use all pads to assure are is cleaned thoroughly.  Let dry.   Apply patch as indicated in monitor instructions.  Patch will be place under collarbone on left side of chest with arrow pointing upward.   Rub patch adhesive wings for 2 minutes.Remove white label marked "1".  Remove white label marked "2".  Rub patch adhesive wings for 2 additional minutes.   While looking in a mirror, press and release button in center of patch.  A small green light will flash 3-4 times .  This will be your only indicator the monitor has been turned on.     Do not shower for the first 24 hours.  You may shower after the first 24 hours.   Press button if you feel a symptom. You will hear a small click.  Record Date, Time and Symptom in the Patient Log Book.   When you are ready to remove patch, follow instructions on last 2 pages of Patient Log Book.  Stick patch monitor onto last page of Patient Log Book.   Place Patient Log Book in [place] box.  Use locking tab on box and tape box closed securely.  The [employer] and [employer] has [employer] on it.  Please place in mailbox as soon as possible.  Your physician should have your test results approximately 7 days after the monitor has been mailed back to [facility].   Call [place] at [phone] if you have questions regarding your ZIO XT patch monitor.  Call them immediately if you see an orange light blinking on your monitor.   If your monitor falls off in less than 4 days contact our Monitor department at [phone].  If your monitor becomes loose or falls off after 4 days call [employer] at [phone] for suggestions on securing your monitor.

## 2021-02-23 NOTE — Progress Notes (Signed)
Patient ID: Derek Chambers, male   DOB: 1966/10/13, 55 y.o.   MRN: [id number] Patient enrolled for [employer] to ship a 3 day ZIO XT long term holter monitor to his home.

## 2021-02-27 DIAGNOSIS — I493 Ventricular premature depolarization: Secondary | ICD-10-CM | POA: Diagnosis not present

## 2021-03-02 ENCOUNTER — Telehealth: Payer: Self-pay

## 2021-03-02 ENCOUNTER — Telehealth: Payer: Self-pay | Admitting: Internal Medicine

## 2021-03-02 DIAGNOSIS — Q208 Other congenital malformations of cardiac chambers and connections: Secondary | ICD-10-CM

## 2021-03-02 NOTE — Telephone Encounter (Signed)
Spoke with patient regarding preferred scheduling preferred weekdays and times for the the Cardiac MRI ordered by Dr. Lovena Le.  Informed patient as soon as we hear regarding the insurance prior authorization, I will be in touch with the appointment information.  He voiced his understanding.Marland Kitchen

## 2021-03-02 NOTE — Telephone Encounter (Signed)
Order placed for MRI.  Pt advised via mychart.

## 2021-03-03 ENCOUNTER — Encounter: Payer: Self-pay | Admitting: Internal Medicine

## 2021-03-03 NOTE — Telephone Encounter (Signed)
Left message regarding Monday 04/20/21 12:00 pm Cardiac MRI appointment at [facility]---arrival time is 11:30 am --1st floor admissions office---will mail information to patient and it is also in My Chart---requested patient call with questions or concerns.

## 2021-03-09 DIAGNOSIS — I493 Ventricular premature depolarization: Secondary | ICD-10-CM | POA: Diagnosis not present

## 2021-03-10 ENCOUNTER — Telehealth: Payer: Self-pay | Admitting: Internal Medicine

## 2021-03-10 ENCOUNTER — Encounter: Payer: Self-pay | Admitting: Internal Medicine

## 2021-03-10 NOTE — Telephone Encounter (Signed)
Patient inquired if the 04/20/21 12:00pm Cardiac MRI appointment could be moved up---rescheduled to Friday 04/03/21 at 8:00am--Left message with new appointment information, will mail information to patient and it is available in My Chart.  Requested patient call with questions or concerns.

## 2021-04-02 ENCOUNTER — Telehealth (HOSPITAL_COMMUNITY): Payer: Self-pay | Admitting: Emergency Medicine

## 2021-04-02 NOTE — Telephone Encounter (Signed)
Reaching out to patient to offer assistance regarding upcoming cardiac imaging study; pt verbalizes understanding of appt date/time, parking situation and where to check in, and verified current allergies; name and call back number provided for further questions should they arise Tenise Stetler RN Navigator Cardiac Imaging [facility] [phone] office [phone] cell   Denies implants, denies claustro  

## 2021-04-02 NOTE — Telephone Encounter (Signed)
Attempted to call patient regarding upcoming cardiac MR appointment. Left message on voicemail with name and callback number Dalton Molesworth RN Navigator Cardiac Imaging [facility] [phone] Office [phone] Cell  

## 2021-04-03 ENCOUNTER — Ambulatory Visit (HOSPITAL_COMMUNITY)
Admission: RE | Admit: 2021-04-03 | Discharge: 2021-04-03 | Disposition: A | Discharge status: Home / Self Care | Payer: 59 | Source: Ambulatory Visit | Attending: Internal Medicine | Admitting: Internal Medicine

## 2021-04-03 ENCOUNTER — Other Ambulatory Visit: Payer: Self-pay

## 2021-04-03 DIAGNOSIS — Q208 Other congenital malformations of cardiac chambers and connections: Secondary | ICD-10-CM

## 2021-04-03 MED ORDER — GADOBUTROL 1 MMOL/ML IV SOLN
10.0000 mL | Freq: Once | INTRAVENOUS | Status: AC | PRN
  Administered 2021-04-03: 10 mL via INTRAVENOUS

## 2021-04-06 DIAGNOSIS — L821 Other seborrheic keratosis: Secondary | ICD-10-CM | POA: Diagnosis not present

## 2021-04-06 DIAGNOSIS — D1801 Hemangioma of skin and subcutaneous tissue: Secondary | ICD-10-CM | POA: Diagnosis not present

## 2021-04-06 DIAGNOSIS — L738 Other specified follicular disorders: Secondary | ICD-10-CM | POA: Diagnosis not present

## 2021-04-06 DIAGNOSIS — Z85828 Personal history of other malignant neoplasm of skin: Secondary | ICD-10-CM | POA: Diagnosis not present

## 2021-04-06 DIAGNOSIS — L570 Actinic keratosis: Secondary | ICD-10-CM | POA: Diagnosis not present

## 2021-04-06 DIAGNOSIS — L814 Other melanin hyperpigmentation: Secondary | ICD-10-CM | POA: Diagnosis not present

## 2021-04-06 DIAGNOSIS — D2271 Melanocytic nevi of right lower limb, including hip: Secondary | ICD-10-CM | POA: Diagnosis not present

## 2021-04-06 DIAGNOSIS — D225 Melanocytic nevi of trunk: Secondary | ICD-10-CM | POA: Diagnosis not present

## 2021-04-06 DIAGNOSIS — Z85820 Personal history of malignant melanoma of skin: Secondary | ICD-10-CM | POA: Diagnosis not present

## 2021-04-15 ENCOUNTER — Telehealth: Payer: Self-pay

## 2021-04-15 MED ORDER — METOPROLOL SUCCINATE ER 25 MG PO TB24
25.0000 mg | ORAL_TABLET | Freq: Every day | ORAL | 0 refills | Status: DC
  Filled 2021-05-14: qty 90, 90d supply, fill #0
  Filled 2021-08-08: qty 90, 90d supply, fill #1
  Filled 2021-11-08: qty 90, 90d supply, fill #2
  Filled 2022-02-11: qty 60, 60d supply, fill #3

## 2021-04-15 NOTE — Telephone Encounter (Signed)
Message sent via mychart.  Prescription for Toprol sent in.

## 2021-04-15 NOTE — Telephone Encounter (Signed)
-----   Message from Evans Lance, MD sent at 04/13/2021  3:44 PM EDT ----- EF looks good. Would suggest adding toprol xl 25 mg daily to start. Followup in 6 weeks.

## 2021-04-20 ENCOUNTER — Other Ambulatory Visit (HOSPITAL_COMMUNITY): Payer: 59

## 2021-04-21 ENCOUNTER — Ambulatory Visit: Payer: 59 | Admitting: Internal Medicine

## 2021-04-29 ENCOUNTER — Other Ambulatory Visit (HOSPITAL_COMMUNITY): Payer: Self-pay

## 2021-04-29 MED FILL — Amitriptyline HCl Tab 25 MG: ORAL | 90 days supply | Qty: 90 | Fill #0 | Status: AC

## 2021-05-14 ENCOUNTER — Other Ambulatory Visit (HOSPITAL_COMMUNITY): Payer: Self-pay

## 2021-05-14 MED ORDER — CARESTART COVID-19 HOME TEST VI KIT
PACK | 0 refills | Status: DC
  Filled 2021-05-14: qty 4, 4d supply, fill #0

## 2021-06-04 ENCOUNTER — Other Ambulatory Visit: Payer: Self-pay

## 2021-06-04 ENCOUNTER — Ambulatory Visit (INDEPENDENT_AMBULATORY_CARE_PROVIDER_SITE_OTHER): Payer: 59 | Admitting: Internal Medicine

## 2021-06-04 ENCOUNTER — Other Ambulatory Visit (HOSPITAL_COMMUNITY): Payer: Self-pay

## 2021-06-04 ENCOUNTER — Encounter: Payer: Self-pay | Admitting: Internal Medicine

## 2021-06-04 VITALS — BP 130/74 | HR 62 | Ht 72.0 in | Wt 202.2 lb

## 2021-06-04 DIAGNOSIS — I493 Ventricular premature depolarization: Secondary | ICD-10-CM | POA: Diagnosis not present

## 2021-06-04 DIAGNOSIS — E785 Hyperlipidemia, unspecified: Secondary | ICD-10-CM | POA: Insufficient documentation

## 2021-06-04 MED ORDER — ROSUVASTATIN CALCIUM 10 MG PO TABS
10.0000 mg | ORAL_TABLET | Freq: Every day | ORAL | 3 refills | Status: DC
  Filled 2021-06-04: qty 90, 90d supply, fill #0
  Filled 2021-08-28: qty 90, 90d supply, fill #1
  Filled 2021-11-30: qty 90, 90d supply, fill #2
  Filled 2022-02-28: qty 90, 90d supply, fill #3

## 2021-06-04 NOTE — Patient Instructions (Addendum)
Medication Instructions:  Your physician has recommended you make the following change in your medication:    Start taking rosuvastatin (Crestor) 10 mg-  Take one tablet by mouth daily   Labwork: You will need fasting lab work in 3 months: September 04, 2021 before you eat-any time after 7;30 am  Testing/Procedures: None ordered.  Follow-Up: Your physician wants you to follow-up in: 6 months with Cristopher Peru, MD   November 30, 2021 at 9:30 am    Any Other Special Instructions Will Be Listed Below (If Applicable).  If you need a refill on your cardiac medications before your next appointment, please call your pharmacy.

## 2021-06-04 NOTE — Progress Notes (Signed)
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 He admits to dietary indiscretion. He reduced his caffeine and ETOH and sugar intake. His PVC's have improved with starting toprol. His symptoms are worse in the morning. He is still walking 3 miles a day, and has no angina. He had a 2D echo which demonstrated some aortic sclerosis but not stenosis, preserved LV function, but a question of increased LV dimensions. There is a question about whether the angulation on the echo was perfectly perpendicular. He underwent a CMRI which showed a small inferolateral scar which was thought due to a very small asymptomatic infarct. His EF is essentially preserved. He had 7% PVC's on his Zio monitor.  No Known Allergies   Current Outpatient Medications  Medication Sig Dispense Refill   amitriptyline (ELAVIL) 25 MG tablet Take 25 mg by mouth at bedtime.     amitriptyline (ELAVIL) 25 MG tablet TAKE 1 TABLET BY MOUTH AT BEDTIME 90 tablet 3   aspirin 81 MG tablet Take 81 mg by mouth daily. 2 tabs qd     cholecalciferol (VITAMIN D) 1000 UNITS tablet Take 1,000 Units by mouth daily.     Coenzyme Q10 (COQ-10) 400 MG CAPS Take 1 tablet by mouth daily.     COVID-19 At Home Antigen Test [facility] COVID-19 HOME TEST) KIT Use as directed 4 each 0   Lysine 500 MG TABS Take 1 tablet by mouth daily.     metoprolol succinate (TOPROL XL) 25 MG 24 hr tablet Take 1 tablet (25 mg total) by mouth daily. 330 tablet 0   Omega-3 Fatty Acids (FISH OIL PO) Take 1 tablet by mouth daily.     No current facility-administered medications for this visit.     Past Medical History:  Diagnosis Date   Cancer ([place])    basal cell (skin)   Elevated cholesterol    Herpes zoster    teenager   Obstructive sleep apnea    mild, REI 8, desaturation to 79% probably REM CPAP 2017    Overactive bladder    Dahlstedt   Shortened PR interval    on EKG    ROS:   All systems reviewed and negative except as noted in the HPI.   Past Surgical History:  Procedure Laterality Date   BASAL CELL CARCINOMA EXCISION  2012   MAXILLARY LE FORTE I OSTEOTOMY     MELANOMA EXCISION  12/2015   SEPTOPLASTY     VASECTOMY  2003   VEIN LIGATION AND 32  1999     Family History  Problem Relation Age of Onset   Cancer Mother    Pancreatic cancer Mother 73   Hypertension Father 55   CVA Father    Atrial fibrillation Father    GER disease Sister    GI Bleed Sister    Depression Sister    Growth hormone deficiency Son    Prostate cancer Paternal Uncle    Colon cancer Neg Hx      Social History   Socioeconomic History   Marital status: Married    Spouse name: Not on file   Number of children: Not on file   Years of education: Not on file   Highest education level: Not on file  Occupational History   Not on file  Tobacco Use  Smoking status: Never   Smokeless tobacco: Never  Substance and Sexual Activity   Alcohol use: Yes    Comment: 2-3 glasses of wine per week   Drug use: No   Sexual activity: Not on file  Other Topics Concern   Not on file  Social History Narrative   Not on file   Social Determinants of Health   Financial Resource Strain: Not on file  Food Insecurity: Not on file  Transportation Needs: Not on file  Physical Activity: Not on file  Stress: Not on file  Social Connections: Not on file  Intimate Partner Violence: Not on file     There were no vitals taken for this visit.  Physical Exam:  Well appearing NAD HEENT: Unremarkable Neck:  No JVD, no thyromegally Lymphatics:  No adenopathy Back:  No CVA tenderness Lungs:  Clear HEART:  Regular rate rhythm, no murmurs, no rubs, no clicks Abd:  soft, positive bowel sounds, no organomegally, no rebound, no guarding Ext:  2 plus pulses, no edema, no cyanosis, no clubbing Skin:  No  rashes no nodules Neuro:  CN II through XII intact, motor grossly intact  EKG  DEVICE  Normal device function.  See PaceArt for details.   Assess/Plan:   1. Symptomatic PVC's - His symptoms have improved with the dietary changes noted above but have not resolved. He has improved with low dose toprol. No limit to exercise. He is still drinking a bit of coffee in excess.  2. Probable CAD - he has evidence of a very small infarct vs focal scarred area of myocarditis with no evidence of active inflammation. I have recommended initiation of a statin. 3. Dyslipidemia - he will start low dose crestor with my plan to increase over time as tolerated. 4. HTN - his bp is better today than his last visit. We will continue the beta blocker. I might consider uptitration in the future.  Carleene Overlie Chelbi Herber,MD

## 2021-06-09 DIAGNOSIS — G4733 Obstructive sleep apnea (adult) (pediatric): Secondary | ICD-10-CM | POA: Diagnosis not present

## 2021-06-17 ENCOUNTER — Other Ambulatory Visit (HOSPITAL_COMMUNITY): Payer: Self-pay

## 2021-06-17 MED ORDER — CARESTART COVID-19 HOME TEST VI KIT
PACK | 0 refills | Status: DC
  Filled 2021-06-17: qty 4, 4d supply, fill #0

## 2021-07-29 ENCOUNTER — Other Ambulatory Visit (HOSPITAL_COMMUNITY): Payer: Self-pay

## 2021-07-31 ENCOUNTER — Other Ambulatory Visit (HOSPITAL_COMMUNITY): Payer: Self-pay

## 2021-07-31 MED ORDER — AMITRIPTYLINE HCL 25 MG PO TABS
ORAL_TABLET | ORAL | 3 refills | Status: DC
  Filled 2021-07-31: qty 90, 90d supply, fill #0
  Filled 2021-10-25: qty 90, 90d supply, fill #1
  Filled 2021-12-25: qty 90, 90d supply, fill #2
  Filled 2022-04-22: qty 90, 90d supply, fill #3

## 2021-08-08 ENCOUNTER — Other Ambulatory Visit (HOSPITAL_COMMUNITY): Payer: Self-pay

## 2021-08-28 ENCOUNTER — Other Ambulatory Visit (HOSPITAL_COMMUNITY): Payer: Self-pay

## 2021-09-04 ENCOUNTER — Other Ambulatory Visit: Payer: Self-pay

## 2021-09-04 ENCOUNTER — Other Ambulatory Visit: Payer: 59 | Admitting: *Deleted

## 2021-09-04 DIAGNOSIS — E785 Hyperlipidemia, unspecified: Secondary | ICD-10-CM | POA: Diagnosis not present

## 2021-09-04 LAB — LIPID PANEL
Chol/HDL Ratio: 2.7 ratio (ref 0.0–5.0)
Cholesterol, Total: 172 mg/dL (ref 100–199)
HDL: 64 mg/dL
LDL Chol Calc (NIH): 91 mg/dL (ref 0–99)
Triglycerides: 94 mg/dL (ref 0–149)
VLDL Cholesterol Cal: 17 mg/dL (ref 5–40)

## 2021-10-05 ENCOUNTER — Other Ambulatory Visit (HOSPITAL_COMMUNITY): Payer: Self-pay

## 2021-10-05 DIAGNOSIS — L570 Actinic keratosis: Secondary | ICD-10-CM | POA: Diagnosis not present

## 2021-10-05 DIAGNOSIS — Z85828 Personal history of other malignant neoplasm of skin: Secondary | ICD-10-CM | POA: Diagnosis not present

## 2021-10-05 DIAGNOSIS — D2371 Other benign neoplasm of skin of right lower limb, including hip: Secondary | ICD-10-CM | POA: Diagnosis not present

## 2021-10-05 DIAGNOSIS — D485 Neoplasm of uncertain behavior of skin: Secondary | ICD-10-CM | POA: Diagnosis not present

## 2021-10-05 DIAGNOSIS — C4441 Basal cell carcinoma of skin of scalp and neck: Secondary | ICD-10-CM | POA: Diagnosis not present

## 2021-10-05 DIAGNOSIS — D2271 Melanocytic nevi of right lower limb, including hip: Secondary | ICD-10-CM | POA: Diagnosis not present

## 2021-10-05 DIAGNOSIS — L738 Other specified follicular disorders: Secondary | ICD-10-CM | POA: Diagnosis not present

## 2021-10-05 DIAGNOSIS — D1801 Hemangioma of skin and subcutaneous tissue: Secondary | ICD-10-CM | POA: Diagnosis not present

## 2021-10-05 DIAGNOSIS — L821 Other seborrheic keratosis: Secondary | ICD-10-CM | POA: Diagnosis not present

## 2021-10-05 DIAGNOSIS — D225 Melanocytic nevi of trunk: Secondary | ICD-10-CM | POA: Diagnosis not present

## 2021-10-05 MED ORDER — CLINDAMYCIN PHOSPHATE 1 % EX LOTN
TOPICAL_LOTION | CUTANEOUS | 2 refills | Status: DC
  Filled 2021-10-05: qty 60, 30d supply, fill #0

## 2021-10-25 ENCOUNTER — Other Ambulatory Visit (HOSPITAL_COMMUNITY): Payer: Self-pay

## 2021-10-26 ENCOUNTER — Other Ambulatory Visit (HOSPITAL_COMMUNITY): Payer: Self-pay

## 2021-11-08 ENCOUNTER — Other Ambulatory Visit (HOSPITAL_COMMUNITY): Payer: Self-pay

## 2021-11-09 ENCOUNTER — Other Ambulatory Visit (HOSPITAL_COMMUNITY): Payer: Self-pay

## 2021-11-09 DIAGNOSIS — Z85828 Personal history of other malignant neoplasm of skin: Secondary | ICD-10-CM | POA: Diagnosis not present

## 2021-11-09 DIAGNOSIS — C4441 Basal cell carcinoma of skin of scalp and neck: Secondary | ICD-10-CM | POA: Diagnosis not present

## 2021-11-09 MED ORDER — DOXYCYCLINE HYCLATE 100 MG PO CAPS
ORAL_CAPSULE | ORAL | 0 refills | Status: DC
  Filled 2021-11-09: qty 10, 5d supply, fill #0

## 2021-11-30 ENCOUNTER — Encounter: Payer: Self-pay | Admitting: Internal Medicine

## 2021-11-30 ENCOUNTER — Other Ambulatory Visit (HOSPITAL_COMMUNITY): Payer: Self-pay

## 2021-11-30 ENCOUNTER — Other Ambulatory Visit: Payer: Self-pay

## 2021-11-30 ENCOUNTER — Ambulatory Visit (INDEPENDENT_AMBULATORY_CARE_PROVIDER_SITE_OTHER): Payer: 59 | Admitting: Internal Medicine

## 2021-11-30 VITALS — BP 132/72 | HR 66 | Ht 72.0 in | Wt 208.0 lb

## 2021-11-30 DIAGNOSIS — I493 Ventricular premature depolarization: Secondary | ICD-10-CM

## 2021-11-30 NOTE — Progress Notes (Signed)
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 His bp runs in the 130 range. He has tried to do better with his diet. He reduced his caffeine and ETOH and sugar intake. He had a 2D echo which demonstrated some aortic sclerosis but not stenosis, preserved LV function, but a question of increased LV dimensions. There is a question about whether the angulation on the echo was perfectly perpendicular. He underwent a CMRI which showed a small inferolateral scar which was thought due to a very small asymptomatic infarct. His EF is essentially preserved. He had 7% PVC's on his Zio monitor remotely.  No Known Allergies   Current Outpatient Medications  Medication Sig Dispense Refill   amitriptyline (ELAVIL) 25 MG tablet TAKE 1 TABLET BY MOUTH ONCE DAILY AT BEDTIME 90 tablet 3   aspirin 81 MG tablet Take 81 mg by mouth daily. 2 tabs qd     cholecalciferol (VITAMIN D) 1000 UNITS tablet Take 1,000 Units by mouth daily.     clindamycin (CLEOCIN T) 1 % lotion Apply a small amount to affected area once a day 60 mL 2   Coenzyme Q10 (COQ-10) 400 MG CAPS Take 1 tablet by mouth daily.     Lysine 500 MG TABS Take 1 tablet by mouth daily.     metoprolol succinate (TOPROL XL) 25 MG 24 hr tablet Take 1 tablet (25 mg total) by mouth daily. 330 tablet 0   rosuvastatin (CRESTOR) 10 MG tablet Take 1 tablet (10 mg total) by mouth daily. 90 tablet 3   amitriptyline (ELAVIL) 25 MG tablet TAKE 1 TABLET BY MOUTH AT BEDTIME 90 tablet 3   COVID-19 At Home Antigen Test (CARESTART COVID-19 HOME TEST) KIT Use as directed (Patient not taking: Reported on 11/30/2021) 4 each 0   doxycycline (VIBRAMYCIN) 100 MG capsule Take 1 capsule by mouth twice a day with meals (Patient not taking: Reported on 11/30/2021) 10 capsule 0   Omega-3 Fatty Acids (FISH OIL PO) Take 1 tablet by mouth daily. (Patient not taking: Reported  on 11/30/2021)     No current facility-administered medications for this visit.     Past Medical History:  Diagnosis Date   Cancer ([place])    basal cell (skin)   Elevated cholesterol    Herpes zoster    teenager   Obstructive sleep apnea    mild, REI 8, desaturation to 79% probably REM CPAP 2017   Overactive bladder    Dahlstedt   Shortened PR interval    on EKG    ROS:   All systems reviewed and negative except as noted in the HPI.   Past Surgical History:  Procedure Laterality Date   BASAL CELL CARCINOMA EXCISION  2012   MAXILLARY LE FORTE I OSTEOTOMY     MELANOMA EXCISION  12/2015   SEPTOPLASTY     VASECTOMY  2003   VEIN LIGATION AND STRIPPING  1999     Family History  Problem Relation Age of Onset   Cancer Mother    Pancreatic cancer Mother 18   Hypertension Father 85   CVA Father    Atrial fibrillation Father    GER disease Sister    GI Bleed Sister    Depression Sister    Growth hormone deficiency Son    Prostate cancer Paternal Uncle    Colon cancer Neg Hx  Social History   Socioeconomic History   Marital status: Married    Spouse name: Not on file   Number of children: Not on file   Years of education: Not on file   Highest education level: Not on file  Occupational History   Not on file  Tobacco Use   Smoking status: Never   Smokeless tobacco: Never  Substance and Sexual Activity   Alcohol use: Yes    Comment: 2-3 glasses of wine per week   Drug use: No   Sexual activity: Not on file  Other Topics Concern   Not on file  Social History Narrative   Not on file   Social Determinants of Health   Financial Resource Strain: Not on file  Food Insecurity: Not on file  Transportation Needs: Not on file  Physical Activity: Not on file  Stress: Not on file  Social Connections: Not on file  Intimate Partner Violence: Not on file     BP 132/72    Pulse 66    Ht 6' (1.829 m)    Wt 208 lb (94.3 kg)    SpO2 96%    BMI 28.21 kg/m    Physical Exam:  Well appearing middle aged man, NAD HEENT: Unremarkable Neck:  No JVD, no thyromegally Lymphatics:  No adenopathy Back:  No CVA tenderness Lungs:  Clear with no wheezes HEART:  Regular rate rhythm, no murmurs, no rubs, no clicks Abd:  soft, positive bowel sounds, no organomegally, no rebound, no guarding Ext:  2 plus pulses, no edema, no cyanosis, no clubbing Skin:  No rashes no nodules Neuro:  CN II through XII intact, motor grossly intact  EKG - nsr  Assess/Plan:  1. Symptomatic PVC's - His symptoms have improved with the dietary changes noted above and He has improved with low dose toprol. No limit to exercise. He is still drinking a bit of coffee in excess but trying to do better with both caffeine and ETOH. 2. Possible CAD - he has evidence of a very small infarct vs focal scarred area of myocarditis with no evidence of active inflammation. I have recommended initiation of a statin. He does not have angina. 3. Dyslipidemia - he will continue low dose crestor. 4. HTN - his bp is better today than his last visit. We will continue the beta blocker. I might consider uptitration in the future but will await worsening of the PVC's.    Carleene Overlie Waldo Damian,MD

## 2021-11-30 NOTE — Patient Instructions (Addendum)
Medication Instructions:  Your physician recommends that you continue on your current medications as directed. Please refer to the Current Medication list given to you today.  Labwork: None ordered.  Testing/Procedures: None ordered.  Follow-Up: Your physician wants you to follow-up in: one year with Cristopher Peru, MD  You will receive a reminder letter in the mail two months in advance. If you don't receive a letter, please call our office to schedule the follow-up appointment.   Any Other Special Instructions Will Be Listed Below (If Applicable).  If you need a refill on your cardiac medications before your next appointment, please call your pharmacy.

## 2021-12-17 DIAGNOSIS — G4733 Obstructive sleep apnea (adult) (pediatric): Secondary | ICD-10-CM | POA: Diagnosis not present

## 2021-12-25 ENCOUNTER — Other Ambulatory Visit (HOSPITAL_COMMUNITY): Payer: Self-pay

## 2021-12-28 ENCOUNTER — Other Ambulatory Visit (HOSPITAL_COMMUNITY): Payer: Self-pay

## 2022-01-04 ENCOUNTER — Other Ambulatory Visit (HOSPITAL_COMMUNITY): Payer: Self-pay

## 2022-01-05 ENCOUNTER — Other Ambulatory Visit (HOSPITAL_COMMUNITY): Payer: Self-pay

## 2022-02-11 ENCOUNTER — Other Ambulatory Visit (HOSPITAL_COMMUNITY): Payer: Self-pay

## 2022-02-16 DIAGNOSIS — H0102B Squamous blepharitis left eye, upper and lower eyelids: Secondary | ICD-10-CM | POA: Diagnosis not present

## 2022-02-16 DIAGNOSIS — H0102A Squamous blepharitis right eye, upper and lower eyelids: Secondary | ICD-10-CM | POA: Diagnosis not present

## 2022-02-16 DIAGNOSIS — G4733 Obstructive sleep apnea (adult) (pediatric): Secondary | ICD-10-CM | POA: Diagnosis not present

## 2022-02-16 DIAGNOSIS — H40013 Open angle with borderline findings, low risk, bilateral: Secondary | ICD-10-CM | POA: Diagnosis not present

## 2022-02-16 DIAGNOSIS — N3281 Overactive bladder: Secondary | ICD-10-CM | POA: Diagnosis not present

## 2022-02-16 DIAGNOSIS — Z0000 Encounter for general adult medical examination without abnormal findings: Secondary | ICD-10-CM | POA: Diagnosis not present

## 2022-02-16 DIAGNOSIS — H2513 Age-related nuclear cataract, bilateral: Secondary | ICD-10-CM | POA: Diagnosis not present

## 2022-02-16 DIAGNOSIS — I493 Ventricular premature depolarization: Secondary | ICD-10-CM | POA: Diagnosis not present

## 2022-02-16 DIAGNOSIS — E7800 Pure hypercholesterolemia, unspecified: Secondary | ICD-10-CM | POA: Diagnosis not present

## 2022-02-22 DIAGNOSIS — G4733 Obstructive sleep apnea (adult) (pediatric): Secondary | ICD-10-CM | POA: Diagnosis not present

## 2022-03-01 ENCOUNTER — Other Ambulatory Visit (HOSPITAL_COMMUNITY): Payer: Self-pay

## 2022-03-25 DIAGNOSIS — G4733 Obstructive sleep apnea (adult) (pediatric): Secondary | ICD-10-CM | POA: Diagnosis not present

## 2022-04-08 ENCOUNTER — Other Ambulatory Visit (HOSPITAL_COMMUNITY): Payer: Self-pay

## 2022-04-08 ENCOUNTER — Other Ambulatory Visit: Payer: Self-pay | Admitting: Internal Medicine

## 2022-04-08 MED ORDER — METOPROLOL SUCCINATE ER 25 MG PO TB24
25.0000 mg | ORAL_TABLET | Freq: Every day | ORAL | 2 refills | Status: DC
  Filled 2022-04-08: qty 90, 90d supply, fill #0
  Filled 2022-07-06: qty 90, 90d supply, fill #1
  Filled 2022-10-06: qty 90, 90d supply, fill #2

## 2022-04-22 ENCOUNTER — Other Ambulatory Visit (HOSPITAL_COMMUNITY): Payer: Self-pay

## 2022-04-24 DIAGNOSIS — G4733 Obstructive sleep apnea (adult) (pediatric): Secondary | ICD-10-CM | POA: Diagnosis not present

## 2022-04-29 DIAGNOSIS — G4733 Obstructive sleep apnea (adult) (pediatric): Secondary | ICD-10-CM | POA: Diagnosis not present

## 2022-05-24 ENCOUNTER — Other Ambulatory Visit: Payer: Self-pay | Admitting: Internal Medicine

## 2022-05-25 ENCOUNTER — Other Ambulatory Visit (HOSPITAL_COMMUNITY): Payer: Self-pay

## 2022-05-25 DIAGNOSIS — G4733 Obstructive sleep apnea (adult) (pediatric): Secondary | ICD-10-CM | POA: Diagnosis not present

## 2022-05-25 MED ORDER — ROSUVASTATIN CALCIUM 10 MG PO TABS
10.0000 mg | ORAL_TABLET | Freq: Every day | ORAL | 1 refills | Status: DC
  Filled 2022-05-25: qty 90, 90d supply, fill #0
  Filled 2022-08-31: qty 90, 90d supply, fill #1

## 2022-05-27 DIAGNOSIS — D1801 Hemangioma of skin and subcutaneous tissue: Secondary | ICD-10-CM | POA: Diagnosis not present

## 2022-05-27 DIAGNOSIS — D2371 Other benign neoplasm of skin of right lower limb, including hip: Secondary | ICD-10-CM | POA: Diagnosis not present

## 2022-05-27 DIAGNOSIS — D485 Neoplasm of uncertain behavior of skin: Secondary | ICD-10-CM | POA: Diagnosis not present

## 2022-05-27 DIAGNOSIS — D2271 Melanocytic nevi of right lower limb, including hip: Secondary | ICD-10-CM | POA: Diagnosis not present

## 2022-05-27 DIAGNOSIS — D225 Melanocytic nevi of trunk: Secondary | ICD-10-CM | POA: Diagnosis not present

## 2022-05-27 DIAGNOSIS — Z85820 Personal history of malignant melanoma of skin: Secondary | ICD-10-CM | POA: Diagnosis not present

## 2022-05-27 DIAGNOSIS — L821 Other seborrheic keratosis: Secondary | ICD-10-CM | POA: Diagnosis not present

## 2022-05-27 DIAGNOSIS — Z85828 Personal history of other malignant neoplasm of skin: Secondary | ICD-10-CM | POA: Diagnosis not present

## 2022-05-27 DIAGNOSIS — B078 Other viral warts: Secondary | ICD-10-CM | POA: Diagnosis not present

## 2022-05-27 DIAGNOSIS — C44319 Basal cell carcinoma of skin of other parts of face: Secondary | ICD-10-CM | POA: Diagnosis not present

## 2022-07-06 ENCOUNTER — Other Ambulatory Visit (HOSPITAL_COMMUNITY): Payer: Self-pay

## 2022-07-20 ENCOUNTER — Other Ambulatory Visit (HOSPITAL_COMMUNITY): Payer: Self-pay

## 2022-07-20 MED ORDER — AMITRIPTYLINE HCL 25 MG PO TABS
25.0000 mg | ORAL_TABLET | Freq: Every day | ORAL | 3 refills | Status: DC
  Filled 2022-07-20: qty 90, 90d supply, fill #0
  Filled 2022-10-19: qty 90, 90d supply, fill #1
  Filled 2023-01-24: qty 90, 90d supply, fill #2
  Filled 2023-04-11: qty 90, 90d supply, fill #3

## 2022-07-21 ENCOUNTER — Other Ambulatory Visit (HOSPITAL_COMMUNITY): Payer: Self-pay

## 2022-08-31 ENCOUNTER — Other Ambulatory Visit (HOSPITAL_COMMUNITY): Payer: Self-pay

## 2022-10-04 DIAGNOSIS — G4733 Obstructive sleep apnea (adult) (pediatric): Secondary | ICD-10-CM | POA: Diagnosis not present

## 2022-10-06 ENCOUNTER — Other Ambulatory Visit: Payer: Self-pay | Admitting: Internal Medicine

## 2022-10-06 ENCOUNTER — Other Ambulatory Visit (HOSPITAL_COMMUNITY): Payer: Self-pay

## 2022-10-08 ENCOUNTER — Other Ambulatory Visit (HOSPITAL_COMMUNITY): Payer: Self-pay

## 2022-10-19 ENCOUNTER — Other Ambulatory Visit (HOSPITAL_COMMUNITY): Payer: Self-pay

## 2022-10-21 ENCOUNTER — Other Ambulatory Visit (HOSPITAL_COMMUNITY): Payer: Self-pay

## 2022-10-21 DIAGNOSIS — C44319 Basal cell carcinoma of skin of other parts of face: Secondary | ICD-10-CM | POA: Diagnosis not present

## 2022-10-21 DIAGNOSIS — Z85828 Personal history of other malignant neoplasm of skin: Secondary | ICD-10-CM | POA: Diagnosis not present

## 2022-10-21 IMAGING — MR MR CARD MORPHOLOGY WO/W CM
45 of 48 series · 45 of 48 positions shown · IV contrast (Contrast agent)
Comparison: none

CLINICAL DATA: PVCs, abnormal echo

EXAM:
CARDIAC MRI
TECHNIQUE: The patient was scanned on a 1.5 Tesla GE magnet. A dedicated
cardiac coil was used. Functional imaging was done using Fiesta
sequences. [DATE], and 4 chamber views were done to assess for RWMA's.
Modified Aksamitas rule using a short axis stack was used to
calculate an ejection fraction on a dedicated work station using
Circle software. The patient received 10 cc of Gadavist. After 10
minutes inversion recovery sequences were used to assess for
infiltration and scar tissue.
CONTRAST:  Gadavist 10 cc

[Series 4: [id number]_bh · axial · 8.0mm · 1.41mm/px · 1 of 16 slices shown]
[im 1/16]
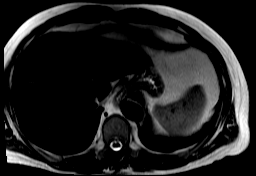

[Series 8: bSSFP · coronal · 8.0mm · 1.61mm/px · 1 of 25 slices shown (1 of 20)]
[im 1/25]
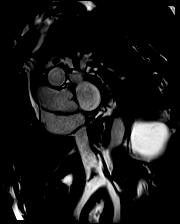

[Series 9: bSSFP · coronal · 8.0mm · 1.61mm/px · 1 of 25 slices shown (2 of 20)]
[im 1/25]
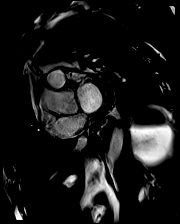

[Series 10: bSSFP · coronal · 8.0mm · 1.61mm/px · 1 of 19 slices shown (3 of 20)]
[im 1/19]
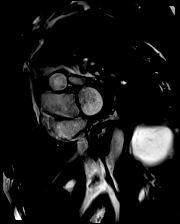

[Series 11: bSSFP · coronal · 8.0mm · 1.61mm/px · 1 of 19 slices shown (4 of 20)]
[im 1/19]
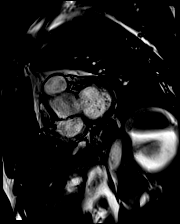

[Series 12: bSSFP · coronal · 8.0mm · 1.61mm/px · 1 of 16 slices shown (5 of 20)]
[im 1/16]
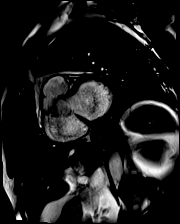

[Series 13: bSSFP · coronal · 8.0mm · 1.61mm/px · 1 of 16 slices shown (6 of 20)]
[im 1/16]
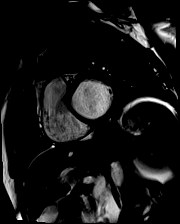

[Series 14: bSSFP · coronal · 8.0mm · 1.61mm/px · 1 of 16 slices shown (7 of 20)]
[im 1/16]
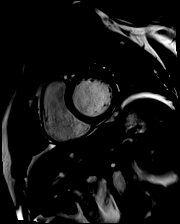

[Series 15: bSSFP · coronal · 8.0mm · 1.61mm/px · 1 of 16 slices shown (8 of 20)]
[im 1/16]
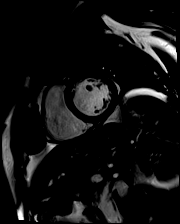

[Series 16: bSSFP · coronal · 8.0mm · 1.61mm/px · 1 of 16 slices shown (9 of 20)]
[im 1/16]
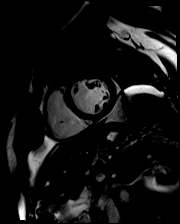

[Series 17: bSSFP · coronal · 8.0mm · 1.61mm/px · 1 of 16 slices shown (10 of 20)]
[im 1/16]
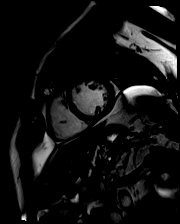

[Series 18: bSSFP · coronal · 8.0mm · 1.61mm/px · 1 of 16 slices shown (11 of 20)]
[im 1/16]
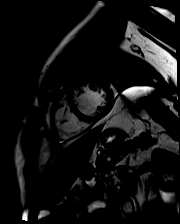

[Series 19: bSSFP · coronal · 8.0mm · 1.61mm/px · 1 of 16 slices shown (12 of 20)]
[im 1/16]
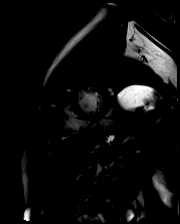

[Series 20: bSSFP · coronal · 8.0mm · 1.61mm/px · 1 of 16 slices shown (13 of 20)]
[im 1/16]
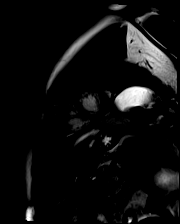

[Series 21: bSSFP · coronal · 8.0mm · 1.61mm/px · 1 of 16 slices shown (14 of 20)]
[im 1/16]
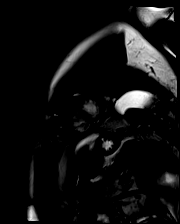

[Series 22: bSSFP · coronal · 8.0mm · 1.61mm/px · 1 of 16 slices shown (15 of 20)]
[im 1/16]
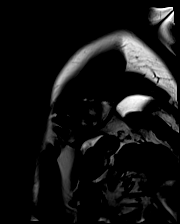

[Series 23: bSSFP · coronal · 8.0mm · 1.61mm/px · 1 of 16 slices shown (16 of 20)]
[im 1/16]
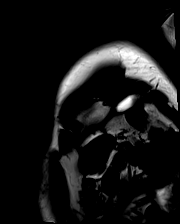

[Series 24: bSSFP · coronal · 8.0mm · 1.61mm/px · 1 of 16 slices shown (17 of 20)]
[im 1/16]
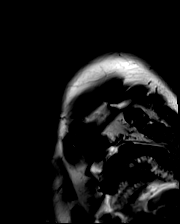

[Series 25: bSSFP · axial · 6.0mm · 1.41mm/px · 1 of 25 slices shown (18 of 20)]
[im 1/25]
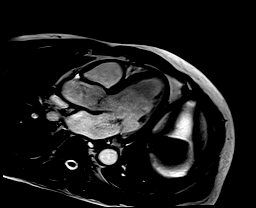

[Series 26: bSSFP · sagittal · 6.0mm · 1.41mm/px · 1 of 25 slices shown (19 of 20)]
[im 1/25]
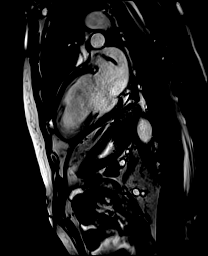

[Series 27: bSSFP · axial · 6.0mm · 1.41mm/px · 1 of 25 slices shown (20 of 20)]
[im 1/25]
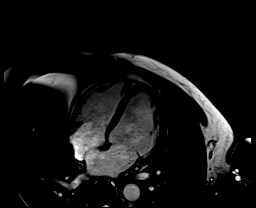

[Series 28: (id)_long_t1 · oblique · 8.0mm · 1.56mm/px · 1 of 24 slices shown]
[im 1/24]
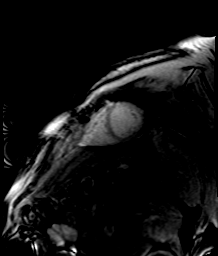

[Series 29: (id)_long_t1_moco · oblique · 8.0mm · 1.56mm/px · 1 of 24 slices shown]
[im 1/24]
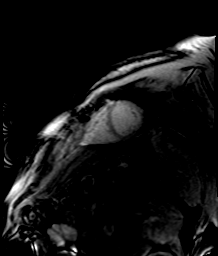

[Series 30: (id)_long_t1_moco_t1 · oblique · 8.0mm · 1.56mm/px · 1 of 6 slices shown]
[im 1/6]
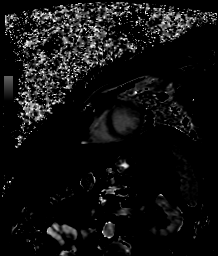

[Series 32: cine_trufi_cs_rt_short axis · coronal · 8.0mm · 1.73mm/px · 1 of 16 slices shown (1 of 18)]
[im 1/16]
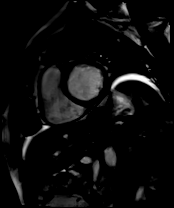

[Series 32: cine_trufi_cs_rt_short axis · coronal · 8.0mm · 1.73mm/px · 1 of 16 slices shown (2 of 18)]
[im 1/16]
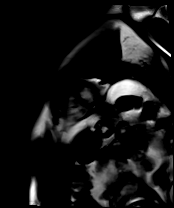

[Series 32: cine_trufi_cs_rt_short axis · coronal · 8.0mm · 1.73mm/px · 1 of 16 slices shown (3 of 18)]
[im 1/16]
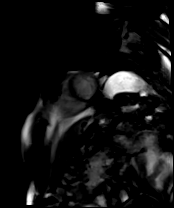

[Series 32: cine_trufi_cs_rt_short axis · coronal · 8.0mm · 1.73mm/px · 1 of 16 slices shown (4 of 18)]
[im 1/16]
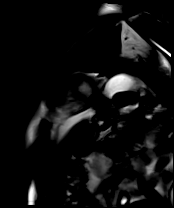

[Series 32: cine_trufi_cs_rt_short axis · coronal · 8.0mm · 1.73mm/px · 1 of 16 slices shown (5 of 18)]
[im 1/16]
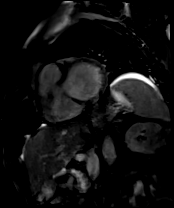

[Series 32: cine_trufi_cs_rt_short axis · coronal · 8.0mm · 1.73mm/px · 1 of 16 slices shown (6 of 18)]
[im 1/16]
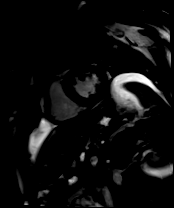

[Series 32: cine_trufi_cs_rt_short axis · coronal · 8.0mm · 1.73mm/px · 1 of 16 slices shown (7 of 18)]
[im 1/16]
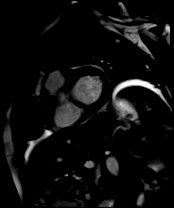

[Series 32: cine_trufi_cs_rt_short axis · coronal · 8.0mm · 1.73mm/px · 1 of 16 slices shown (8 of 18)]
[im 1/16]
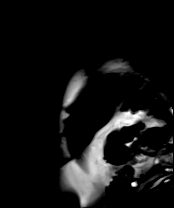

[Series 32: cine_trufi_cs_rt_short axis · coronal · 8.0mm · 1.73mm/px · 1 of 16 slices shown (9 of 18)]
[im 1/16]
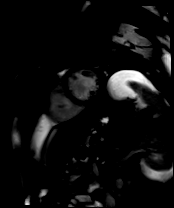

[Series 32: cine_trufi_cs_rt_short axis · coronal · 8.0mm · 1.73mm/px · 1 of 16 slices shown (10 of 18)]
[im 1/16]
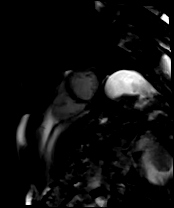

[Series 32: cine_trufi_cs_rt_short axis · coronal · 8.0mm · 1.73mm/px · 1 of 16 slices shown (11 of 18)]
[im 1/16]
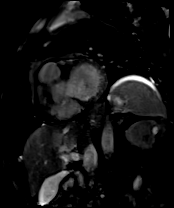

[Series 32: cine_trufi_cs_rt_short axis · coronal · 8.0mm · 1.73mm/px · 1 of 16 slices shown (12 of 18)]
[im 1/16]
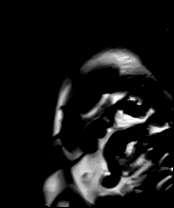

[Series 32: cine_trufi_cs_rt_short axis · coronal · 8.0mm · 1.73mm/px · 1 of 16 slices shown (13 of 18)]
[im 1/16]
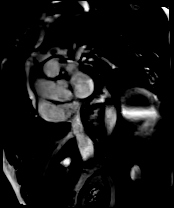

[Series 32: cine_trufi_cs_rt_short axis · coronal · 8.0mm · 1.73mm/px · 1 of 16 slices shown (14 of 18)]
[im 1/16]
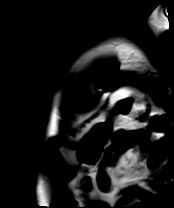

[Series 32: cine_trufi_cs_rt_short axis · coronal · 8.0mm · 1.73mm/px · 1 of 16 slices shown (15 of 18)]
[im 1/16]
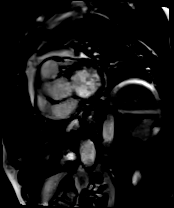

[Series 32: cine_trufi_cs_rt_short axis · coronal · 8.0mm · 1.73mm/px · 1 of 16 slices shown (16 of 18)]
[im 1/16]
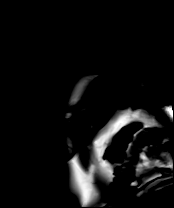

[Series 32: cine_trufi_cs_rt_short axis · coronal · 8.0mm · 1.73mm/px · 1 of 16 slices shown (17 of 18)]
[im 1/16]
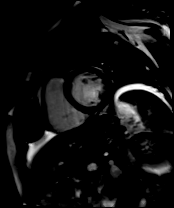

[Series 32: cine_trufi_cs_rt_short axis · coronal · 8.0mm · 1.73mm/px · 1 of 16 slices shown (18 of 18)]
[im 1/16]
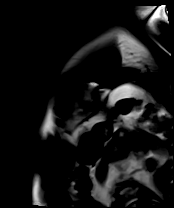

[Series 33: (id)_trufi · oblique · 8.0mm · 2.08mm/px · 1 of 9 slices shown]
[im 1/9]
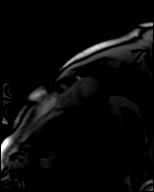

[Series 34: (id)_trufi_moco · oblique · 8.0mm · 2.08mm/px · 1 of 9 slices shown]
[im 1/9]
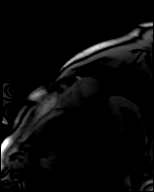

[Series 35: (id)_trufi_moco_t2 · oblique · 8.0mm · 2.08mm/px · 1 of 3 slices shown]
[im 1/3]
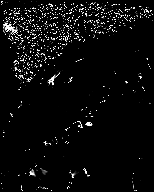

[45 of 48 positions shown; findings below may reference images not displayed]

FINDINGS: Limited images of the lung fields showed no gross abnormalities.

The left ventricle is mildly dilated. Normal LV wall thickness. No
LV wall motion abnormalities, EF 57%. Mildly dilated right ventricle
with normal systolic function, EF 51%. Mild left atrial enlargement
and mild right atrial enlargement. Trileaflet aortic valve with no
significant stenosis or regurgitation. Mitral regurgitation looks
mild.

On delayed enhancement images, there is a small area of about 50%
wall thickness subendocardial late gadolinium enhancement (LGE) in
the mid inferolateral wall.

Measurements:

LVEDV 209 mL
LVSV 120 mL
LVEF 57%

RVEDV 228 mL
RVSV 116 mL
RVEF 51%
IMPRESSION: 1. Mildly dilated left ventricle, normal wall thickness. Normal EF
57% with no wall motion abnormalities.

2.  Mildly dilated RV with normal systolic function, EF 51%.

3.  Mild biatrial enlargement.

4. There is a small area of about 50% wall thickness subendocardial
LGE in the mid inferolateral wall. This looks most like a small area
of prior MI. There is no correponding wall motion abnormality.

The inferolateral wall scar could explain PVCs, but does not explain
the mild 4 chamber dilation.

Keo Gracias

## 2022-10-21 MED ORDER — DOXYCYCLINE HYCLATE 100 MG PO CAPS
100.0000 mg | ORAL_CAPSULE | Freq: Two times a day (BID) | ORAL | 0 refills | Status: DC
  Filled 2022-10-21: qty 10, 5d supply, fill #0

## 2022-11-01 ENCOUNTER — Other Ambulatory Visit (HOSPITAL_COMMUNITY): Payer: Self-pay

## 2022-11-27 ENCOUNTER — Other Ambulatory Visit: Payer: Self-pay | Admitting: Internal Medicine

## 2022-11-29 ENCOUNTER — Other Ambulatory Visit: Payer: Self-pay

## 2022-11-29 ENCOUNTER — Other Ambulatory Visit (HOSPITAL_COMMUNITY): Payer: Self-pay

## 2022-11-29 DIAGNOSIS — L700 Acne vulgaris: Secondary | ICD-10-CM | POA: Diagnosis not present

## 2022-11-29 DIAGNOSIS — I788 Other diseases of capillaries: Secondary | ICD-10-CM | POA: Diagnosis not present

## 2022-11-29 DIAGNOSIS — L821 Other seborrheic keratosis: Secondary | ICD-10-CM | POA: Diagnosis not present

## 2022-11-29 DIAGNOSIS — D1801 Hemangioma of skin and subcutaneous tissue: Secondary | ICD-10-CM | POA: Diagnosis not present

## 2022-11-29 DIAGNOSIS — Z85820 Personal history of malignant melanoma of skin: Secondary | ICD-10-CM | POA: Diagnosis not present

## 2022-11-29 DIAGNOSIS — L603 Nail dystrophy: Secondary | ICD-10-CM | POA: Diagnosis not present

## 2022-11-29 DIAGNOSIS — Z85828 Personal history of other malignant neoplasm of skin: Secondary | ICD-10-CM | POA: Diagnosis not present

## 2022-11-29 MED ORDER — ROSUVASTATIN CALCIUM 10 MG PO TABS
10.0000 mg | ORAL_TABLET | Freq: Every day | ORAL | 0 refills | Status: DC
  Filled 2022-11-29: qty 90, 90d supply, fill #0

## 2022-12-27 ENCOUNTER — Other Ambulatory Visit: Payer: Self-pay

## 2022-12-27 ENCOUNTER — Other Ambulatory Visit (HOSPITAL_COMMUNITY): Payer: Self-pay

## 2022-12-27 ENCOUNTER — Other Ambulatory Visit: Payer: Self-pay | Admitting: Internal Medicine

## 2022-12-27 MED ORDER — METOPROLOL SUCCINATE ER 25 MG PO TB24
25.0000 mg | ORAL_TABLET | Freq: Every day | ORAL | 0 refills | Status: DC
  Filled 2022-12-27: qty 30, 30d supply, fill #0

## 2023-01-25 ENCOUNTER — Other Ambulatory Visit: Payer: Self-pay

## 2023-01-28 ENCOUNTER — Ambulatory Visit: Payer: 59 | Attending: Internal Medicine | Admitting: Internal Medicine

## 2023-01-28 ENCOUNTER — Encounter: Payer: Self-pay | Admitting: Internal Medicine

## 2023-01-28 VITALS — BP 122/80 | HR 82 | Ht 72.0 in | Wt 207.8 lb

## 2023-01-28 DIAGNOSIS — I493 Ventricular premature depolarization: Secondary | ICD-10-CM | POA: Diagnosis not present

## 2023-01-28 NOTE — Progress Notes (Signed)
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 His bp runs in the 130 range. He has tried to do better with his diet. He reduced his caffeine and ETOH and sugar intake. He had a 2D echo which demonstrated some aortic sclerosis but not stenosis, preserved LV function, but a question of increased LV dimensions. He underwent a CMRI remotely which showed a small inferolateral scar which was thought due to a very small asymptomatic infarct. His EF is essentially preserved. He had 7% PVC's on his Zio monitor remotely. He has been maintained on low dose toprol. He has occaisional break throughs on the toprol.  No Known Allergies   Current Outpatient Medications  Medication Sig Dispense Refill   amitriptyline (ELAVIL) 25 MG tablet TAKE 1 TABLET BY MOUTH ONCE DAILY AT BEDTIME 90 tablet 3   aspirin 81 MG tablet Take 81 mg by mouth daily. 2 tabs qd     cholecalciferol (VITAMIN D) 1000 UNITS tablet Take 1,000 Units by mouth daily.     clindamycin (CLEOCIN T) 1 % lotion Apply a small amount to affected area once a day 60 mL 2   COVID-19 At Home Antigen Test (CARESTART COVID-19 HOME TEST) KIT Use as directed 4 each 0   Lysine 500 MG TABS Take 1 tablet by mouth daily.     metoprolol succinate (TOPROL XL) 25 MG 24 hr tablet Take 1 tablet (25 mg total) by mouth daily. Please keep your upcoming appointment in February with Dr. Lovena Le for future refills. Thank you. 30 tablet 0   rosuvastatin (CRESTOR) 10 MG tablet Take 1 tablet (10 mg total) by mouth daily. 90 tablet 0   amitriptyline (ELAVIL) 25 MG tablet TAKE 1 TABLET BY MOUTH AT BEDTIME 90 tablet 3   Coenzyme Q10 (COQ-10) 400 MG CAPS Take 1 tablet by mouth daily.     doxycycline (VIBRAMYCIN) 100 MG capsule Take 1 capsule by mouth twice a day with meals (Patient not taking: Reported on 11/30/2021) 10 capsule 0   doxycycline (VIBRAMYCIN) 100 MG  capsule Take 1 capsule (100 mg total) by mouth 2 (two) times daily for 5 days 10 capsule 0   Omega-3 Fatty Acids (FISH OIL PO) Take 1 tablet by mouth daily. (Patient not taking: Reported on 11/30/2021)     No current facility-administered medications for this visit.     Past Medical History:  Diagnosis Date   Cancer ([place])    basal cell (skin)   Elevated cholesterol    Herpes zoster    teenager   Obstructive sleep apnea    mild, REI 8, desaturation to 79% probably REM CPAP 2017   Overactive bladder    Dahlstedt   Shortened PR interval    on EKG    ROS:   All systems reviewed and negative except as noted in the HPI.   Past Surgical History:  Procedure Laterality Date   BASAL CELL CARCINOMA EXCISION  2012   MAXILLARY LE FORTE I OSTEOTOMY     MELANOMA EXCISION  12/2015   SEPTOPLASTY     VASECTOMY  2003   VEIN LIGATION AND STRIPPING  1999     Family History  Problem Relation Age of Onset   Cancer Mother    Pancreatic cancer Mother 7   Hypertension Father 57   CVA Father    Atrial fibrillation Father  GER disease Sister    GI Bleed Sister    Depression Sister    Growth hormone deficiency Son    Prostate cancer Paternal Uncle    Colon cancer Neg Hx      Social History   Socioeconomic History   Marital status: Married    Spouse name: Not on file   Number of children: Not on file   Years of education: Not on file   Highest education level: Not on file  Occupational History   Not on file  Tobacco Use   Smoking status: Never   Smokeless tobacco: Never  Substance and Sexual Activity   Alcohol use: Yes    Comment: 2-3 glasses of wine per week   Drug use: No   Sexual activity: Not on file  Other Topics Concern   Not on file  Social History Narrative   Not on file   Social Determinants of Health   Financial Resource Strain: Not on file  Food Insecurity: Not on file  Transportation Needs: Not on file  Physical Activity: Not on file  Stress: Not on  file  Social Connections: Not on file  Intimate Partner Violence: Not on file     There were no vitals taken for this visit.  Physical Exam:  Well appearing NAD HEENT: Unremarkable Neck:  No JVD, no thyromegally Lymphatics:  No adenopathy Back:  No CVA tenderness Lungs:  Clear with no wheezes HEART:  IRegular rate rhythm, no murmurs, no rubs, no clicks Abd:  soft, positive bowel sounds, no organomegally, no rebound, no guarding Ext:  2 plus pulses, no edema, no cyanosis, no clubbing Skin:  No rashes no nodules Neuro:  CN II through XII intact, motor grossly intact  EKG - nsr with occasional PVC's  DEVICE  Normal device function.  See PaceArt for details.   Assess/Plan: 1. Symptomatic PVC's - His symptoms have improved with the dietary changes noted above and He has improved with low dose toprol. No limit to exercise. He is still drinking a bit of coffee in excess but trying to do better with both caffeine and ETOH. 2. Possible CAD - he has evidence of a very small infarct vs focal scarred area of myocarditis with no evidence of active inflammation. He does not have angina. 3. HTN - his bp is better today than his last visit. We will continue the beta blocker. I might consider uptitration in the future but will await worsening of the PVC's.    Carleene Overlie Annaleigh Steinmeyer,MD

## 2023-01-28 NOTE — Patient Instructions (Signed)
Medication Instructions:  Your physician recommends that you continue on your current medications as directed. Please refer to the Current Medication list given to you today.  *If you need a refill on your cardiac medications before your next appointment, please call your pharmacy*  Follow-Up: At [facility], you and your health needs are our priority.  As part of our continuing mission to provide you with exceptional heart care, we have created designated Provider Care Teams.  These Care Teams include your primary Cardiologist (physician) and Advanced Practice Providers (APPs -  Physician Assistants and Nurse Practitioners) who all work together to provide you with the care you need, when you need it.  Your next appointment:   1 year(s)  Provider:   You may see Cristopher Peru, MD or one of the following Advanced Practice Providers on your designated Care Team:   Tommye Standard, [place] "Jonni Sanger" [place], [place], NP

## 2023-01-31 ENCOUNTER — Other Ambulatory Visit (HOSPITAL_COMMUNITY): Payer: Self-pay

## 2023-01-31 ENCOUNTER — Other Ambulatory Visit: Payer: Self-pay | Admitting: Internal Medicine

## 2023-01-31 ENCOUNTER — Other Ambulatory Visit: Payer: Self-pay

## 2023-01-31 MED ORDER — METOPROLOL SUCCINATE ER 25 MG PO TB24
25.0000 mg | ORAL_TABLET | Freq: Every day | ORAL | 0 refills | Status: DC
  Filled 2023-01-31: qty 30, 30d supply, fill #0

## 2023-03-02 ENCOUNTER — Other Ambulatory Visit: Payer: Self-pay

## 2023-03-02 ENCOUNTER — Other Ambulatory Visit (HOSPITAL_COMMUNITY): Payer: Self-pay

## 2023-03-02 ENCOUNTER — Other Ambulatory Visit: Payer: Self-pay | Admitting: Internal Medicine

## 2023-03-02 MED ORDER — ROSUVASTATIN CALCIUM 10 MG PO TABS
10.0000 mg | ORAL_TABLET | Freq: Every day | ORAL | 3 refills | Status: DC
  Filled 2023-03-02: qty 90, 90d supply, fill #0
  Filled 2023-05-29: qty 90, 90d supply, fill #1
  Filled 2023-08-24: qty 90, 90d supply, fill #2
  Filled 2023-10-17 – 2023-12-03 (×2): qty 90, 90d supply, fill #3

## 2023-03-02 MED ORDER — METOPROLOL SUCCINATE ER 25 MG PO TB24
25.0000 mg | ORAL_TABLET | Freq: Every day | ORAL | 3 refills | Status: DC
  Filled 2023-03-02: qty 90, 90d supply, fill #0
  Filled 2023-05-29: qty 90, 90d supply, fill #1
  Filled 2023-09-08: qty 90, 90d supply, fill #2
  Filled 2023-12-03: qty 90, 90d supply, fill #3

## 2023-04-11 ENCOUNTER — Other Ambulatory Visit (HOSPITAL_COMMUNITY): Payer: Self-pay

## 2023-05-28 DIAGNOSIS — G4733 Obstructive sleep apnea (adult) (pediatric): Secondary | ICD-10-CM | POA: Diagnosis not present

## 2023-05-30 ENCOUNTER — Other Ambulatory Visit (HOSPITAL_COMMUNITY): Payer: Self-pay

## 2023-08-01 ENCOUNTER — Other Ambulatory Visit (HOSPITAL_COMMUNITY): Payer: Self-pay

## 2023-08-08 ENCOUNTER — Other Ambulatory Visit (HOSPITAL_COMMUNITY): Payer: Self-pay

## 2023-08-08 MED ORDER — AMITRIPTYLINE HCL 25 MG PO TABS
25.0000 mg | ORAL_TABLET | Freq: Every day | ORAL | 4 refills | Status: DC
  Filled 2023-08-08: qty 90, 90d supply, fill #0
  Filled 2023-11-02: qty 90, 90d supply, fill #1
  Filled 2024-02-01: qty 90, 90d supply, fill #2
  Filled 2024-05-06: qty 90, 90d supply, fill #3
  Filled 2024-08-06: qty 90, 90d supply, fill #4

## 2023-08-09 ENCOUNTER — Other Ambulatory Visit (HOSPITAL_COMMUNITY): Payer: Self-pay

## 2023-08-22 DIAGNOSIS — Z0000 Encounter for general adult medical examination without abnormal findings: Secondary | ICD-10-CM | POA: Diagnosis not present

## 2023-08-22 DIAGNOSIS — E7800 Pure hypercholesterolemia, unspecified: Secondary | ICD-10-CM | POA: Diagnosis not present

## 2023-08-22 DIAGNOSIS — G4733 Obstructive sleep apnea (adult) (pediatric): Secondary | ICD-10-CM | POA: Diagnosis not present

## 2023-08-22 DIAGNOSIS — Z1211 Encounter for screening for malignant neoplasm of colon: Secondary | ICD-10-CM | POA: Diagnosis not present

## 2023-08-22 DIAGNOSIS — R030 Elevated blood-pressure reading, without diagnosis of hypertension: Secondary | ICD-10-CM | POA: Diagnosis not present

## 2023-08-22 DIAGNOSIS — Z125 Encounter for screening for malignant neoplasm of prostate: Secondary | ICD-10-CM | POA: Diagnosis not present

## 2023-08-22 DIAGNOSIS — N3281 Overactive bladder: Secondary | ICD-10-CM | POA: Diagnosis not present

## 2023-08-22 DIAGNOSIS — I493 Ventricular premature depolarization: Secondary | ICD-10-CM | POA: Diagnosis not present

## 2023-08-22 DIAGNOSIS — Z79899 Other long term (current) drug therapy: Secondary | ICD-10-CM | POA: Diagnosis not present

## 2023-08-22 LAB — LAB REPORT - SCANNED: EGFR: 82

## 2023-08-24 ENCOUNTER — Other Ambulatory Visit (HOSPITAL_COMMUNITY): Payer: Self-pay

## 2023-09-08 ENCOUNTER — Other Ambulatory Visit: Payer: Self-pay

## 2023-10-17 ENCOUNTER — Other Ambulatory Visit (HOSPITAL_COMMUNITY): Payer: Self-pay

## 2023-11-02 ENCOUNTER — Other Ambulatory Visit (HOSPITAL_COMMUNITY): Payer: Self-pay

## 2023-11-16 DIAGNOSIS — G4733 Obstructive sleep apnea (adult) (pediatric): Secondary | ICD-10-CM | POA: Diagnosis not present

## 2023-12-03 ENCOUNTER — Other Ambulatory Visit (HOSPITAL_COMMUNITY): Payer: Self-pay

## 2023-12-05 ENCOUNTER — Other Ambulatory Visit (HOSPITAL_COMMUNITY): Payer: Self-pay

## 2023-12-19 DIAGNOSIS — Z85820 Personal history of malignant melanoma of skin: Secondary | ICD-10-CM | POA: Diagnosis not present

## 2023-12-19 DIAGNOSIS — D225 Melanocytic nevi of trunk: Secondary | ICD-10-CM | POA: Diagnosis not present

## 2023-12-19 DIAGNOSIS — D1801 Hemangioma of skin and subcutaneous tissue: Secondary | ICD-10-CM | POA: Diagnosis not present

## 2023-12-19 DIAGNOSIS — D2271 Melanocytic nevi of right lower limb, including hip: Secondary | ICD-10-CM | POA: Diagnosis not present

## 2023-12-19 DIAGNOSIS — L821 Other seborrheic keratosis: Secondary | ICD-10-CM | POA: Diagnosis not present

## 2023-12-19 DIAGNOSIS — L700 Acne vulgaris: Secondary | ICD-10-CM | POA: Diagnosis not present

## 2023-12-19 DIAGNOSIS — L738 Other specified follicular disorders: Secondary | ICD-10-CM | POA: Diagnosis not present

## 2023-12-19 DIAGNOSIS — Z85828 Personal history of other malignant neoplasm of skin: Secondary | ICD-10-CM | POA: Diagnosis not present

## 2023-12-19 DIAGNOSIS — L814 Other melanin hyperpigmentation: Secondary | ICD-10-CM | POA: Diagnosis not present

## 2023-12-20 ENCOUNTER — Other Ambulatory Visit: Payer: Self-pay

## 2023-12-20 ENCOUNTER — Other Ambulatory Visit (HOSPITAL_BASED_OUTPATIENT_CLINIC_OR_DEPARTMENT_OTHER): Payer: Self-pay

## 2023-12-20 MED ORDER — TRETINOIN 0.025 % EX CREA
TOPICAL_CREAM | CUTANEOUS | 5 refills | Status: DC
  Filled 2023-12-20: qty 45, 30d supply, fill #0

## 2023-12-20 MED ORDER — CLINDAMYCIN PHOSPHATE 1 % EX LOTN
TOPICAL_LOTION | CUTANEOUS | 5 refills | Status: DC
  Filled 2023-12-20: qty 60, 30d supply, fill #0

## 2023-12-22 ENCOUNTER — Other Ambulatory Visit (HOSPITAL_COMMUNITY): Payer: Self-pay

## 2024-01-23 ENCOUNTER — Ambulatory Visit: Payer: 59 | Admitting: Student

## 2024-02-02 ENCOUNTER — Other Ambulatory Visit (HOSPITAL_COMMUNITY): Payer: Self-pay

## 2024-02-02 NOTE — Progress Notes (Unsigned)
  Electrophysiology Office Note:   Date:  02/03/2024  ID:  Yariel Ferraris, DOB [birth year], MRN [id number]  Primary Cardiologist: None Electrophysiologist: Lewayne Bunting, MD      History of Present Illness:   Delmas Faucett is a 58 y.o. male with h/o PVCs, HTN, and possible CAD seen today for routine electrophysiology followup.   Since last being seen in our clinic the patient reports doing very well. He has occasional PVCs with no clear aggravating factors and is satisfied with current control. Queries if being TOO hydrated could aggravate. Otherwise, he denies chest pain, dyspnea, PND, orthopnea, nausea, vomiting, dizziness, syncope, edema, weight gain, or early satiety.   Review of systems complete and found to be negative unless listed in HPI.   EP Information / Studies Reviewed:    EKG is ordered today. Personal review as below.  EKG Interpretation Date/Time:  Friday February 03 2024 11:16:58 EST Ventricular Rate:  79 PR Interval:  170 QRS Duration:  90 QT Interval:  370 QTC Calculation: 424 R Axis:   -18  Text Interpretation: Normal sinus rhythm Normal ECG No previous ECGs available Confirmed by Maxine Glenn [phone]) on 02/03/2024 11:23:26 AM    Arrhythmia/Device History  cMRI 03/2021 Mildly dilated LV, EF 57%, Mildly dilated RV - 51%, mild BAE, small 50% LGE in the mid inferolateral wall   Physical Exam:   VS:  BP (!) 126/90   Pulse 82   Ht 6' (1.829 m)   Wt 207 lb 9.6 oz (94.2 kg)   SpO2 97%   BMI 28.16 kg/m    Wt Readings from Last 3 Encounters:  02/03/24 207 lb 9.6 oz (94.2 kg)  01/28/23 207 lb 12.8 oz (94.3 kg)  11/30/21 208 lb (94.3 kg)     GEN: No acute distress NECK: No JVD; No carotid bruits CARDIAC: Regular rate and rhythm, no murmurs, rubs, gallops RESPIRATORY:  Clear to auscultation without rales, wheezing or rhonchi  ABDOMEN: Soft, non-tender, non-distended EXTREMITIES:  No edema; No deformity   ASSESSMENT AND PLAN:    PVCs EKG today shows NSR  without PVCs Symptoms rare and well controlled.  Possible CAD cMRI showed possible small area of prior MI No s/s ischemia and normal EF. Follow.  Asks about Calcium score per PCP. Would be completely reasonable. He wishes to hold off for now.   HTN Stable on current regimen   Follow up with EP MD in 12 months to establish from Dr. Ladona Ridgel. Pt has no preference.   Signed, Graciella Freer, PA-C

## 2024-02-03 ENCOUNTER — Encounter: Payer: Self-pay | Admitting: Student

## 2024-02-03 ENCOUNTER — Ambulatory Visit: Payer: 59 | Attending: Student | Admitting: Student

## 2024-02-03 VITALS — BP 126/90 | HR 82 | Ht 72.0 in | Wt 207.6 lb

## 2024-02-03 DIAGNOSIS — I10 Essential (primary) hypertension: Secondary | ICD-10-CM | POA: Diagnosis not present

## 2024-02-03 DIAGNOSIS — I493 Ventricular premature depolarization: Secondary | ICD-10-CM

## 2024-02-03 NOTE — Patient Instructions (Signed)
Medication Instructions:  Your physician recommends that you continue on your current medications as directed. Please refer to the Current Medication list given to you today.  *If you need a refill on your cardiac medications before your next appointment, please call your pharmacy*  Lab Work: None ordered If you have labs (blood work) drawn today and your tests are completely normal, you will receive your results only by: MyChart Message (if you have MyChart) OR A paper copy in the mail If you have any lab test that is abnormal or we need to change your treatment, we will call you to review the results.  Follow-Up: At [facility], you and your health needs are our priority.  As part of our continuing mission to provide you with exceptional heart care, we have created designated Provider Care Teams.  These Care Teams include your primary Cardiologist (physician) and Advanced Practice Providers (APPs -  Physician Assistants and Nurse Practitioners) who all work together to provide you with the care you need, when you need it.  Your next appointment:   1 year(s)  Provider:   Gregg Taylor, MD  

## 2024-02-23 DIAGNOSIS — H0102A Squamous blepharitis right eye, upper and lower eyelids: Secondary | ICD-10-CM | POA: Diagnosis not present

## 2024-02-23 DIAGNOSIS — H0102B Squamous blepharitis left eye, upper and lower eyelids: Secondary | ICD-10-CM | POA: Diagnosis not present

## 2024-02-23 DIAGNOSIS — H40013 Open angle with borderline findings, low risk, bilateral: Secondary | ICD-10-CM | POA: Diagnosis not present

## 2024-02-23 DIAGNOSIS — H2513 Age-related nuclear cataract, bilateral: Secondary | ICD-10-CM | POA: Diagnosis not present

## 2024-05-01 DIAGNOSIS — G4733 Obstructive sleep apnea (adult) (pediatric): Secondary | ICD-10-CM | POA: Diagnosis not present

## 2024-05-06 ENCOUNTER — Other Ambulatory Visit: Payer: Self-pay | Admitting: Internal Medicine

## 2024-05-07 ENCOUNTER — Other Ambulatory Visit (HOSPITAL_COMMUNITY): Payer: Self-pay

## 2024-05-08 ENCOUNTER — Other Ambulatory Visit (HOSPITAL_COMMUNITY): Payer: Self-pay

## 2024-05-08 MED ORDER — ROSUVASTATIN CALCIUM 10 MG PO TABS
10.0000 mg | ORAL_TABLET | Freq: Every day | ORAL | 2 refills | Status: AC
  Filled 2024-05-08: qty 90, 90d supply, fill #0
  Filled 2024-07-29: qty 90, 90d supply, fill #1
  Filled 2024-11-01: qty 90, 90d supply, fill #2

## 2024-05-09 ENCOUNTER — Other Ambulatory Visit: Payer: Self-pay

## 2024-06-12 ENCOUNTER — Other Ambulatory Visit (HOSPITAL_COMMUNITY): Payer: Self-pay

## 2024-06-12 ENCOUNTER — Other Ambulatory Visit: Payer: Self-pay | Admitting: Internal Medicine

## 2024-06-12 MED ORDER — METOPROLOL SUCCINATE ER 25 MG PO TB24
25.0000 mg | ORAL_TABLET | Freq: Every day | ORAL | 3 refills | Status: AC
  Filled 2024-06-12: qty 90, 90d supply, fill #0
  Filled 2024-09-06: qty 90, 90d supply, fill #1
  Filled 2024-12-05: qty 90, 90d supply, fill #2

## 2024-07-10 DIAGNOSIS — G4733 Obstructive sleep apnea (adult) (pediatric): Secondary | ICD-10-CM | POA: Diagnosis not present

## 2024-07-30 ENCOUNTER — Other Ambulatory Visit (HOSPITAL_COMMUNITY): Payer: Self-pay

## 2024-07-30 DIAGNOSIS — Z85828 Personal history of other malignant neoplasm of skin: Secondary | ICD-10-CM | POA: Diagnosis not present

## 2024-07-30 DIAGNOSIS — D485 Neoplasm of uncertain behavior of skin: Secondary | ICD-10-CM | POA: Diagnosis not present

## 2024-07-30 DIAGNOSIS — C4441 Basal cell carcinoma of skin of scalp and neck: Secondary | ICD-10-CM | POA: Diagnosis not present

## 2024-07-30 DIAGNOSIS — L820 Inflamed seborrheic keratosis: Secondary | ICD-10-CM | POA: Diagnosis not present

## 2024-08-07 ENCOUNTER — Other Ambulatory Visit (HOSPITAL_COMMUNITY): Payer: Self-pay

## 2024-08-08 ENCOUNTER — Other Ambulatory Visit: Payer: Self-pay

## 2024-08-08 ENCOUNTER — Other Ambulatory Visit (HOSPITAL_COMMUNITY): Payer: Self-pay

## 2024-08-08 MED ORDER — IMIQUIMOD 5 % EX CREA
TOPICAL_CREAM | CUTANEOUS | 1 refills | Status: DC
  Filled 2024-08-08: qty 24, 32d supply, fill #0

## 2024-08-15 ENCOUNTER — Encounter: Payer: Self-pay | Admitting: Gastroenterology

## 2024-08-23 DIAGNOSIS — I493 Ventricular premature depolarization: Secondary | ICD-10-CM | POA: Diagnosis not present

## 2024-08-23 DIAGNOSIS — C4491 Basal cell carcinoma of skin, unspecified: Secondary | ICD-10-CM | POA: Diagnosis not present

## 2024-08-23 DIAGNOSIS — G4733 Obstructive sleep apnea (adult) (pediatric): Secondary | ICD-10-CM | POA: Diagnosis not present

## 2024-08-23 DIAGNOSIS — N3281 Overactive bladder: Secondary | ICD-10-CM | POA: Diagnosis not present

## 2024-08-23 DIAGNOSIS — R030 Elevated blood-pressure reading, without diagnosis of hypertension: Secondary | ICD-10-CM | POA: Diagnosis not present

## 2024-08-23 DIAGNOSIS — Z125 Encounter for screening for malignant neoplasm of prostate: Secondary | ICD-10-CM | POA: Diagnosis not present

## 2024-08-23 DIAGNOSIS — Z0000 Encounter for general adult medical examination without abnormal findings: Secondary | ICD-10-CM | POA: Diagnosis not present

## 2024-08-23 DIAGNOSIS — E7800 Pure hypercholesterolemia, unspecified: Secondary | ICD-10-CM | POA: Diagnosis not present

## 2024-08-23 DIAGNOSIS — Z23 Encounter for immunization: Secondary | ICD-10-CM | POA: Diagnosis not present

## 2024-08-23 DIAGNOSIS — Z79899 Other long term (current) drug therapy: Secondary | ICD-10-CM | POA: Diagnosis not present

## 2024-09-06 ENCOUNTER — Other Ambulatory Visit (HOSPITAL_COMMUNITY): Payer: Self-pay

## 2024-09-07 ENCOUNTER — Other Ambulatory Visit (HOSPITAL_COMMUNITY): Payer: Self-pay

## 2024-09-07 MED ORDER — AMITRIPTYLINE HCL 25 MG PO TABS: 25.0000 mg | ORAL_TABLET | Freq: Every day | ORAL | 3 refills | Status: DC

## 2024-09-07 MED ORDER — AMITRIPTYLINE HCL 25 MG PO TABS
25.0000 mg | ORAL_TABLET | Freq: Every day | ORAL | 3 refills | Status: AC
  Filled 2024-09-07 – 2024-11-01 (×2): qty 90, 90d supply, fill #0

## 2024-09-08 ENCOUNTER — Other Ambulatory Visit (HOSPITAL_COMMUNITY): Payer: Self-pay

## 2024-09-25 ENCOUNTER — Other Ambulatory Visit (HOSPITAL_COMMUNITY): Payer: Self-pay

## 2024-09-25 ENCOUNTER — Ambulatory Visit (AMBULATORY_SURGERY_CENTER)

## 2024-09-25 VITALS — Ht 72.0 in | Wt 207.0 lb

## 2024-09-25 DIAGNOSIS — Z1211 Encounter for screening for malignant neoplasm of colon: Secondary | ICD-10-CM

## 2024-09-25 MED ORDER — NA SULFATE-K SULFATE-MG SULF 17.5-3.13-1.6 GM/177ML PO SOLN
1.0000 | Freq: Once | ORAL | 0 refills | Status: AC
  Filled 2024-09-25 – 2024-09-26 (×2): qty 354, 1d supply, fill #0

## 2024-09-25 NOTE — Progress Notes (Signed)

## 2024-09-26 ENCOUNTER — Other Ambulatory Visit: Payer: Self-pay

## 2024-09-26 ENCOUNTER — Other Ambulatory Visit (HOSPITAL_COMMUNITY): Payer: Self-pay

## 2024-09-27 ENCOUNTER — Other Ambulatory Visit: Payer: Self-pay

## 2024-10-15 ENCOUNTER — Encounter: Payer: Self-pay | Admitting: Gastroenterology

## 2024-10-19 ENCOUNTER — Encounter: Payer: Self-pay | Admitting: Gastroenterology

## 2024-10-19 ENCOUNTER — Ambulatory Visit (AMBULATORY_SURGERY_CENTER): Admitting: Gastroenterology

## 2024-10-19 VITALS — BP 106/68 | HR 56 | Temp 98.3°F | Resp 13 | Ht 72.0 in | Wt 207.0 lb

## 2024-10-19 DIAGNOSIS — K635 Polyp of colon: Secondary | ICD-10-CM

## 2024-10-19 DIAGNOSIS — D122 Benign neoplasm of ascending colon: Secondary | ICD-10-CM | POA: Diagnosis not present

## 2024-10-19 DIAGNOSIS — D127 Benign neoplasm of rectosigmoid junction: Secondary | ICD-10-CM

## 2024-10-19 DIAGNOSIS — I10 Essential (primary) hypertension: Secondary | ICD-10-CM | POA: Diagnosis not present

## 2024-10-19 DIAGNOSIS — D120 Benign neoplasm of cecum: Secondary | ICD-10-CM | POA: Diagnosis not present

## 2024-10-19 DIAGNOSIS — K641 Second degree hemorrhoids: Secondary | ICD-10-CM | POA: Diagnosis not present

## 2024-10-19 DIAGNOSIS — D128 Benign neoplasm of rectum: Secondary | ICD-10-CM

## 2024-10-19 DIAGNOSIS — Z1211 Encounter for screening for malignant neoplasm of colon: Secondary | ICD-10-CM

## 2024-10-19 DIAGNOSIS — K648 Other hemorrhoids: Secondary | ICD-10-CM

## 2024-10-19 DIAGNOSIS — G4733 Obstructive sleep apnea (adult) (pediatric): Secondary | ICD-10-CM | POA: Diagnosis not present

## 2024-10-19 MED ORDER — SODIUM CHLORIDE 0.9 % IV SOLN: 500.0000 mL | INTRAVENOUS | Status: DC

## 2024-10-19 NOTE — Patient Instructions (Signed)
-   High fiber diet. - Use FiberCon 1-2 tablets PO daily. - Continue present medications. - Await pathology results.day preparation or 1 week of Miralax/Dulcolax prior to preparation.  YOU HAD AN ENDOSCOPIC PROCEDURE TODAY AT THE [facility]:   Refer to the procedure report that was given to you for any specific questions about what was found during the examination.  If the procedure report does not answer your questions, please call your gastroenterologist to clarify.  If you requested that your care partner not be given the details of your procedure findings, then the procedure report has been included in a sealed envelope for you to review at your convenience later.  YOU SHOULD EXPECT: Some feelings of bloating in the abdomen. Passage of more gas than usual.  Walking can help get rid of the air that was put into your GI tract during the procedure and reduce the bloating. If you had a lower endoscopy (such as a colonoscopy or flexible sigmoidoscopy) you may notice spotting of blood in your stool or on the toilet paper. If you underwent a bowel prep for your procedure, you may not have a normal bowel movement for a few days.  Please Note:  You might notice some irritation and congestion in your nose or some drainage.  This is from the oxygen used during your procedure.  There is no need for concern and it should clear up in a day or so.  SYMPTOMS TO REPORT IMMEDIATELY:  Following lower endoscopy (colonoscopy or flexible sigmoidoscopy):  Excessive amounts of blood in the stool  Significant tenderness or worsening of abdominal pains  Swelling of the abdomen that is new, acute  Fever of 100F or higher  For urgent or emergent issues, a gastroenterologist can be reached at any hour by calling [phone]. Do not use MyChart messaging for urgent concerns.    DIET:  We do recommend a small meal at first, but then you may proceed to your regular diet.  Drink plenty of fluids but you  should avoid alcoholic beverages for 24 hours.  ACTIVITY:  You should plan to take it easy for the rest of today and you should NOT DRIVE or use heavy machinery until tomorrow (because of the sedation medicines used during the test).    FOLLOW UP: Our staff will call the number listed on your records the next business day following your procedure.  We will call around 7:15- 8:00 am to check on you and address any questions or concerns that you may have regarding the information given to you following your procedure. If we do not reach you, we will leave a message.     If any biopsies were taken you will be contacted by phone or by letter within the next 1-3 weeks.  Please call us  at [phone] if you have not heard about the biopsies in 3 weeks.    SIGNATURES/CONFIDENTIALITY: You and/or your care partner have signed paperwork which will be entered into your electronic medical record.  These signatures attest to the fact that that the information above on your After Visit Summary has been reviewed and is understood.  Full responsibility of the confidentiality of this discharge information lies with you and/or your care-partner.

## 2024-10-19 NOTE — Progress Notes (Signed)
 GASTROENTEROLOGY PROCEDURE H&P NOTE   Primary Care Physician: Signa Rush, MD (Inactive)  HPI: Derek Chambers is a 58 y.o. male who presents for Colonoscopy for screening.  Past Medical History:  Diagnosis Date   Cancer (HCC)    basal cell (skin)   Elevated cholesterol    Herpes zoster    teenager   Hypertension    Obstructive sleep apnea    mild, REI 8, desaturation to 79% probably REM CPAP 2017   Overactive bladder    [place]   Shortened PR interval    on EKG   Past Surgical History:  Procedure Laterality Date   BASAL CELL CARCINOMA EXCISION  2012   MAXILLARY LE FORTE I OSTEOTOMY     MELANOMA EXCISION  12/2015   SEPTOPLASTY     VASECTOMY  2003   VEIN LIGATION AND STRIPPING  1999   Current Outpatient Medications  Medication Sig Dispense Refill   amitriptyline  (ELAVIL ) 25 MG tablet Take 1 tablet (25 mg total) by mouth at bedtime. 90 tablet 3   aspirin 81 MG tablet Take 81 mg by mouth daily. 2 tabs qd     Lysine 500 MG TABS Take 1 tablet by mouth daily.     metoprolol  succinate (TOPROL  XL) 25 MG 24 hr tablet Take 1 tablet (25 mg total) by mouth daily. 90 tablet 3   Multiple Vitamins-Minerals (MULTIVIT/MULTIMINERAL ADULT PO) Take by mouth.     rosuvastatin  (CRESTOR ) 10 MG tablet Take 1 tablet (10 mg total) by mouth daily. 90 tablet 2   Current Facility-Administered Medications  Medication Dose Route Frequency Provider Last Rate Last Admin   0.9 %  sodium chloride  infusion  500 mL Intravenous Continuous Mansouraty, Jane Birkel Jr., MD        Current Outpatient Medications:    amitriptyline  (ELAVIL ) 25 MG tablet, Take 1 tablet (25 mg total) by mouth at bedtime., Disp: 90 tablet, Rfl: 3   aspirin 81 MG tablet, Take 81 mg by mouth daily. 2 tabs qd, Disp: , Rfl:    Lysine 500 MG TABS, Take 1 tablet by mouth daily., Disp: , Rfl:    metoprolol  succinate (TOPROL  XL) 25 MG 24 hr tablet, Take 1 tablet (25 mg total) by mouth daily., Disp: 90 tablet, Rfl: 3   Multiple  Vitamins-Minerals (MULTIVIT/MULTIMINERAL ADULT PO), Take by mouth., Disp: , Rfl:    rosuvastatin  (CRESTOR ) 10 MG tablet, Take 1 tablet (10 mg total) by mouth daily., Disp: 90 tablet, Rfl: 2  Current Facility-Administered Medications:    0.9 %  sodium chloride  infusion, 500 mL, Intravenous, Continuous, Mansouraty, Aloha Raddle., MD No Known Allergies Family History  Problem Relation Age of Onset   Cancer Mother    Pancreatic cancer Mother 42   Hypertension Father 87   CVA Father    Atrial fibrillation Father    GER disease Sister    GI Bleed Sister    Depression Sister    Prostate cancer Paternal Uncle    Growth hormone deficiency Son    Colon cancer Neg Hx    Colon polyps Neg Hx    Esophageal cancer Neg Hx    Rectal cancer Neg Hx    Stomach cancer Neg Hx    Social History   Socioeconomic History   Marital status: Married    Spouse name: Not on file   Number of children: Not on file   Years of education: Not on file   Highest education level: Not on file  Occupational History  Not on file  Tobacco Use   Smoking status: Never   Smokeless tobacco: Never  Vaping Use   Vaping status: Never Used  Substance and Sexual Activity   Alcohol use: Yes    Comment: 2-3 glasses of wine per week   Drug use: No   Sexual activity: Not on file  Other Topics Concern   Not on file  Social History Narrative   Not on file   Social Drivers of Health   Financial Resource Strain: Not on file  Food Insecurity: Not on file  Transportation Needs: Not on file  Physical Activity: Not on file  Stress: Not on file  Social Connections: Not on file  Intimate Partner Violence: Not on file    Physical Exam: Today's Vitals   10/19/24 1252  BP: (!) 148/89  Pulse: 64  Temp: 98.3 F (36.8 C)  TempSrc: Temporal  SpO2: 95%  Weight: 207 lb (93.9 kg)  Height: 6' (1.829 m)   Body mass index is 28.07 kg/m. GEN: NAD EYE: Sclerae anicteric ENT: MMM CV: Non-tachycardic GI: Soft,  NT/ND NEURO:  Alert & Oriented x 3  Lab Results: No results for input(s): WBC, HGB, HCT, PLT in the last 72 hours. BMET No results for input(s): NA, K, CL, CO2, GLUCOSE, BUN, CREATININE, CALCIUM  in the last 72 hours. LFT No results for input(s): PROT, ALBUMIN, AST, ALT, ALKPHOS, BILITOT, BILIDIR, IBILI in the last 72 hours. PT/INR No results for input(s): LABPROT, INR in the last 72 hours.   Impression / Plan: This is a 58 y.o.male who presents for Colonoscopy screening.  The risks and benefits of endoscopic evaluation/treatment were discussed with the patient and/or family; these include but are not limited to the risk of perforation, infection, bleeding, missed lesions, lack of diagnosis, severe illness requiring hospitalization, as well as anesthesia and sedation related illnesses.  The patient's history has been reviewed, patient examined, no change in status, and deemed stable for procedure.  The patient and/or family was provided an opportunity to ask questions and all were answered.  The patient and/or family is agreeable to proceed.    [name], MD [facility] Advanced Endoscopy Office # [phone]

## 2024-10-19 NOTE — Progress Notes (Signed)
 Report to PACU, RN, vss, BBS= Clear.

## 2024-10-19 NOTE — Progress Notes (Signed)
 Pt's states no medical or surgical changes since previsit or office visit.

## 2024-10-19 NOTE — Op Note (Signed)
 [facility] Patient Name: Derek Chambers Procedure Date: 10/19/2024 1:07 PM MRN: [id number] Endoscopist: [name] , MD, [id number] Age: 58 Referring MD:  Date of Birth: July 06, 1966 Gender: Male Account #: [id number] Procedure:                Colonoscopy Indications:              Screening for colorectal malignant neoplasm Medicines:                Monitored Anesthesia Care Procedure:                Pre-Anesthesia Assessment:                           - Prior to the procedure, a History and Physical                            was performed, and patient medications and                            allergies were reviewed. The patient's tolerance of                            previous anesthesia was also reviewed. The risks                            and benefits of the procedure and the sedation                            options and risks were discussed with the patient.                            All questions were answered, and informed consent                            was obtained. Prior Anticoagulants: The patient has                            taken no anticoagulant or antiplatelet agents                            except for aspirin. ASA Grade Assessment: II - A                            patient with mild systemic disease. After reviewing                            the risks and benefits, the patient was deemed in                            satisfactory condition to undergo the procedure.                           After obtaining informed consent, the colonoscope  was passed under direct vision. Throughout the                            procedure, the patient's blood pressure, pulse, and                            oxygen saturations were monitored continuously. The                            CF [id number] #[id number] was introduced through the anus                            and advanced to the the cecum. The colonoscopy was                             somewhat difficult due to significant looping.                            Successful completion of the procedure was aided by                            changing the patient to a supine position, using                            manual pressure, straightening and shortening the                            scope to obtain bowel loop reduction and using                            scope torsion. The patient tolerated the procedure.                            The quality of the bowel preparation was adequate.                            The ileocecal valve, appendiceal orifice, and                            rectum were photographed. Scope In: 1:40:18 PM Scope Out: 2:04:26 PM Scope Withdrawal Time: 0 hours 15 minutes 20 seconds  Total Procedure Duration: 0 hours 24 minutes 8 seconds  Findings:                 The digital rectal exam findings include                            hemorrhoids. Pertinent negatives include no                            palpable rectal lesions.                           A large amount of semi-liquid stool was found in  the entire colon, interfering with visualization.                            Lavage of the area was performed using copious                            amounts, resulting in clearance with adequate                            visualization.                           Four sessile polyps were found in the rectum,                            recto-sigmoid colon, ascending colon and cecum. The                            polyps were 2 to 5 mm in size. These polyps were                            removed with a cold snare. Resection and retrieval                            were complete.                           Normal mucosa was found in the entire colon                            otherwise.                           Non-bleeding non-thrombosed internal hemorrhoids                            were found during retroflexion,  during perianal                            exam and during digital exam. The hemorrhoids were                            Grade II (internal hemorrhoids that prolapse but                            reduce spontaneously). Complications:            No immediate complications. Estimated Blood Loss:     Estimated blood loss was minimal. Impression:               - Hemorrhoids found on digital rectal exam.                           - Stool in the entire examined colon - lavaged  copiously with adequate visualization.                           - Four 2 to 5 mm polyps in the rectum, at the                            recto-sigmoid colon, in the ascending colon and in                            the cecum, removed with a cold snare. Resected and                            retrieved.                           - Normal mucosa in the entire examined colon                            otherwise.                           - Non-bleeding non-thrombosed internal hemorrhoids. Recommendation:           - The patient will be observed post-procedure,                            until all discharge criteria are met.                           - Discharge patient to home.                           - Patient has a contact number available for                            emergencies. The signs and symptoms of potential                            delayed complications were discussed with the                            patient. Return to normal activities tomorrow.                            Written discharge instructions were provided to the                            patient.                           - High fiber diet.                           - Use FiberCon 1-2 tablets PO daily.                           -  Continue present medications.                           - Await pathology results.                           - Repeat colonoscopy in 3/04/18/09 years for                             surveillance based on pathology results. Either 2                            day preparation or 1 week of Miralax/Dulcolax prior                            to preparation.                           - The findings and recommendations were discussed                            with the patient.                           - The findings and recommendations were discussed                            with the patient's family. [name], MD 10/19/2024 2:14:14 PM

## 2024-10-19 NOTE — Progress Notes (Signed)
 Called to room to assist during endoscopic procedure.  Patient ID and intended procedure confirmed with present staff. Received instructions for my participation in the procedure from the performing physician.

## 2024-10-22 ENCOUNTER — Telehealth: Payer: Self-pay

## 2024-10-22 NOTE — Telephone Encounter (Signed)
 Attempted to reach patient for follow up phone call. No answer, left voicemail to contact Dr. Melba office with any questions or concerns.

## 2024-10-24 LAB — SURGICAL PATHOLOGY

## 2024-10-25 ENCOUNTER — Ambulatory Visit: Payer: Self-pay | Admitting: Gastroenterology

## 2024-11-01 ENCOUNTER — Other Ambulatory Visit (HOSPITAL_COMMUNITY): Payer: Self-pay

## 2024-11-01 ENCOUNTER — Other Ambulatory Visit: Payer: Self-pay

## 2024-12-05 ENCOUNTER — Other Ambulatory Visit (HOSPITAL_COMMUNITY): Payer: Self-pay

## 2024-12-10 ENCOUNTER — Other Ambulatory Visit (HOSPITAL_COMMUNITY): Payer: Self-pay

## 2025-02-26 ENCOUNTER — Ambulatory Visit: Admitting: Student
